# Patient Record
Sex: Female | Born: 1965 | Race: Black or African American | Hispanic: No | Marital: Single | Smoking: Never smoker
Health system: Southern US, Community
[De-identification: ages and names within clinical notes are randomized; demographics above are authoritative.]

## PROBLEM LIST (undated history)

## (undated) DIAGNOSIS — E119 Type 2 diabetes mellitus without complications: Secondary | ICD-10-CM

## (undated) DIAGNOSIS — I10 Essential (primary) hypertension: Secondary | ICD-10-CM

---

## 2005-10-15 ENCOUNTER — Emergency Department: Payer: Self-pay | Admitting: Emergency Medicine

## 2008-10-28 ENCOUNTER — Emergency Department: Payer: Self-pay | Admitting: Internal Medicine

## 2019-04-09 ENCOUNTER — Other Ambulatory Visit: Payer: Self-pay

## 2019-04-09 ENCOUNTER — Emergency Department: Payer: 59

## 2019-04-09 ENCOUNTER — Emergency Department
Admission: EM | Admit: 2019-04-09 | Discharge: 2019-04-09 | Disposition: A | Discharge status: Home / Self Care | Payer: 59 | Attending: Emergency Medicine | Admitting: Emergency Medicine

## 2019-04-09 ENCOUNTER — Encounter: Payer: Self-pay | Admitting: Emergency Medicine

## 2019-04-09 DIAGNOSIS — K122 Cellulitis and abscess of mouth: Secondary | ICD-10-CM | POA: Diagnosis not present

## 2019-04-09 DIAGNOSIS — D649 Anemia, unspecified: Secondary | ICD-10-CM | POA: Diagnosis not present

## 2019-04-09 DIAGNOSIS — E119 Type 2 diabetes mellitus without complications: Secondary | ICD-10-CM | POA: Diagnosis not present

## 2019-04-09 DIAGNOSIS — Z7984 Long term (current) use of oral hypoglycemic drugs: Secondary | ICD-10-CM | POA: Insufficient documentation

## 2019-04-09 DIAGNOSIS — I10 Essential (primary) hypertension: Secondary | ICD-10-CM | POA: Insufficient documentation

## 2019-04-09 DIAGNOSIS — R221 Localized swelling, mass and lump, neck: Secondary | ICD-10-CM | POA: Diagnosis present

## 2019-04-09 HISTORY — DX: Type 2 diabetes mellitus without complications: E11.9

## 2019-04-09 HISTORY — DX: Essential (primary) hypertension: I10

## 2019-04-09 LAB — CBC WITH DIFFERENTIAL/PLATELET
Abs Immature Granulocytes: 0.02 10*3/uL (ref 0.00–0.07)
Basophils Absolute: 0 10*3/uL (ref 0.0–0.1)
Basophils Relative: 0 %
Eosinophils Absolute: 0 10*3/uL (ref 0.0–0.5)
Eosinophils Relative: 0 %
HCT: 33.6 % — ABNORMAL LOW (ref 36.0–46.0)
Hemoglobin: 10.1 g/dL — ABNORMAL LOW (ref 12.0–15.0)
Immature Granulocytes: 0 %
Lymphocytes Relative: 32 %
Lymphs Abs: 1.5 10*3/uL (ref 0.7–4.0)
MCH: 27.7 pg (ref 26.0–34.0)
MCHC: 30.1 g/dL (ref 30.0–36.0)
MCV: 92.3 fL (ref 80.0–100.0)
Monocytes Absolute: 0.3 10*3/uL (ref 0.1–1.0)
Monocytes Relative: 6 %
Neutro Abs: 2.9 10*3/uL (ref 1.7–7.7)
Neutrophils Relative %: 62 %
Platelets: 407 10*3/uL — ABNORMAL HIGH (ref 150–400)
RBC: 3.64 MIL/uL — ABNORMAL LOW (ref 3.87–5.11)
RDW: 16.3 % — ABNORMAL HIGH (ref 11.5–15.5)
WBC: 4.6 10*3/uL (ref 4.0–10.5)
nRBC: 0 % (ref 0.0–0.2)

## 2019-04-09 LAB — BASIC METABOLIC PANEL
Anion gap: 10 (ref 5–15)
BUN: 14 mg/dL (ref 6–20)
CO2: 30 mmol/L (ref 22–32)
Calcium: 9.3 mg/dL (ref 8.9–10.3)
Chloride: 102 mmol/L (ref 98–111)
Creatinine, Ser: 0.88 mg/dL (ref 0.44–1.00)
GFR calc Af Amer: >60 mL/min (ref >60)
GFR calc non Af Amer: >60 mL/min (ref >60)
Glucose, Bld: 112 mg/dL — ABNORMAL HIGH (ref 70–99)
Potassium: 3.9 mmol/L (ref 3.5–5.1)
Sodium: 142 mmol/L (ref 135–145)

## 2019-04-09 MED ORDER — DEXAMETHASONE SODIUM PHOSPHATE 10 MG/ML IJ SOLN
10.0000 mg | Freq: Once | INTRAMUSCULAR | Status: AC
  Administered 2019-04-09: 10 mg via INTRAMUSCULAR
  Filled 2019-04-09: qty 1

## 2019-04-09 MED ORDER — PREDNISONE 10 MG PO TABS: ORAL_TABLET | ORAL | 0 refills | Status: DC

## 2019-04-09 NOTE — ED Triage Notes (Signed)
First RN Note: Pt presents to ED via POV with c/o tonsillar swelling. Pt able to maintain her own secretions at this time, no muffled voice upon arrival.

## 2019-04-09 NOTE — ED Provider Notes (Signed)
[facility] Emergency Department Provider Note   ____________________________________________   First MD Initiated Contact with Patient 04/09/19 [phone]     (approximate)  I have reviewed the triage vital signs and the nursing notes.   HISTORY  Chief Complaint Sore Throat   HPI Veronica Lewis is a 54 y.o. female resents to the ED with complaint of uvular swelling.  Patient states that she went to her PCP yesterday for routine visit and renewal of her prescriptions.  She states that she woke up with her uvula swollen.  Initially she thought it was because she slept with her mouth open.  Patient states that the discomfort is increased with swallowing.  She denies any fever, chills, nausea or vomiting.  She denies any known exposure to strep.  Patient continues to swallow her secretions and talk without any difficulty.      Past Medical History:  Diagnosis Date  . Diabetes mellitus without complication (HCC)   . Hypertension     There are no problems to display for this patient.   History reviewed. No pertinent surgical history.  Prior to Admission medications   Medication Sig Start Date End Date Taking? Authorizing Provider  lisinopril (ZESTRIL) 20 MG tablet Take 20 mg by mouth daily.   Yes [provider]  metFORMIN (GLUCOPHAGE) 500 MG tablet Take 500 mg by mouth 2 (two) times daily with a meal.   Yes [provider]  predniSONE (DELTASONE) 10 MG tablet Take 6 tablets  today, on day 2 take 5 tablets, day 3 take 4 tablets, day 4 take 3 tablets, day 5 take  2 tablets and 1 tablet the last day 04/09/19   Tommi Rumps, PA-C    Allergies Aspirin  No family history on file.  Social History Social History   Tobacco Use  . Smoking status: Not on file  Substance Use Topics  . Alcohol use: Not on file  . Drug use: Not on file    Review of Systems Constitutional: No fever/chills Eyes: No visual changes. ENT: No sore  throat.  Positive swollen uvula. Cardiovascular: Denies chest pain. Respiratory: Denies shortness of breath.  Negative for cough. Gastrointestinal: No abdominal pain.  No nausea, no vomiting.  No diarrhea. Musculoskeletal: Negative for muscle aches. Skin: Negative for rash. Neurological: Negative for headaches, focal weakness or numbness. ____________________________________________   PHYSICAL EXAM:  VITAL SIGNS: ED Triage Vitals  Enc Vitals Group     BP 04/09/19 0806 138/89     Pulse Rate 04/09/19 0806 80     Resp 04/09/19 0806 18     Temp 04/09/19 0806 97.7 F (36.5 C)     Temp Source 04/09/19 0806 Oral     SpO2 04/09/19 0806 97 %     Weight 04/09/19 0800 229 lb (103.9 kg)     Height 04/09/19 0800 5' (1.524 m)     Head Circumference --      Peak Flow --      Pain Score 04/09/19 0759 0     Pain Loc --      Pain Edu? --      Excl. in GC? --     Constitutional: Alert and oriented. Well appearing and in no acute distress.  Patient is able to talk in complete sentences and also maintains secretions without difficulty. Eyes: Conjunctivae are normal. PERRL. EOMI. Head: Atraumatic. Nose: No congestion/rhinnorhea.  Mouth/Throat: Mucous membranes are moist.  Oropharynx non-erythematous.  Uvula is edematous without erythema and  no exudate was noted. Neck: No stridor.   Hematological/Lymphatic/Immunilogical: No cervical lymphadenopathy. Cardiovascular: Normal rate, regular rhythm. Grossly normal heart sounds.  Good peripheral circulation. Respiratory: Normal respiratory effort.  No retractions. Lungs occasional faint expiratory wheezes heard especially upper left area. Gastrointestinal: Soft and nontender. No distention.  Musculoskeletal: Moves upper and lower extremities they have difficulty normal gait was noted. Neurologic:  Normal speech and language. No gross focal neurologic deficits are appreciated. No gait instability. Skin:  Skin is warm, dry and intact. No rash  noted. Psychiatric: Mood and affect are normal. Speech and behavior are normal.  ____________________________________________   LABS (all labs ordered are listed, but only abnormal results are displayed)  Labs Reviewed  CBC WITH DIFFERENTIAL/PLATELET - Abnormal; Notable for the following components:      Result Value   RBC 3.64 (*)    Hemoglobin 10.1 (*)    HCT 33.6 (*)    RDW 16.3 (*)    Platelets 407 (*)    All other components within normal limits  BASIC METABOLIC PANEL - Abnormal; Notable for the following components:   Glucose, Bld 112 (*)    All other components within normal limits   _RADIOLOGY   Official radiology report(s): DG Chest 2 View  Result Date: 04/09/2019 CLINICAL DATA:  Wheezing, no history of asthma or smoking. Additional history provided: Patient reports tonsillar swelling. EXAM: CHEST - 2 VIEW COMPARISON:  No pertinent prior studies available for comparison. FINDINGS: Heart size within normal limits. There is no evidence of airspace consolidation within the lungs. No evidence of pleural effusion or pneumothorax. No acute bony abnormality. S-shaped curvature of the thoracolumbar spine. IMPRESSION: No evidence of acute cardiopulmonary abnormality. S-shaped curvature of the thoracolumbar spine. Electronically Signed   By: Jackey Loge DO   On: 04/09/2019 08:51    ____________________________________________   PROCEDURES  Procedure(s) performed (including Critical Care):  Procedures   ____________________________________________   INITIAL IMPRESSION / ASSESSMENT AND PLAN / ED COURSE  As part of my medical decision making, I reviewed the following data within the electronic MEDICAL RECORD NUMBER Notes from prior ED visits and [place] Controlled Substance Database   54 year old female presents to the ED with complaint of uvula swelling.  Patient states she was seen in her office yesterday for routine checkup and renewal of her prescriptions.  Patient states that  this morning when she woke up she has some discomfort in her throat and thought this was from sleeping with her mouth open.  She then noticed that the uvula was swollen.  She denies any fever, chills, nausea or vomiting.  She denies any exposure to Covid.  Patient was given Decadron 10 mg IV while waiting for her lab results.  Hemoglobin/ hematocrit are consistent with anemia has a history of  anemia.  Today's lab work was compared with her doctors lab work that was done yesterday and no urgent changes.  Patient was made aware and will follow up with her PCP.  Patient was discharged with a prescription for prednisone 60 mg 6-day taper.  She is return to the emergency department if any severe worsening of her symptoms.  She is also to follow-up with [facility] if not improving.  ____________________________________________   FINAL CLINICAL IMPRESSION(S) / ED DIAGNOSES  Final diagnoses:  Uvulitis  Anemia, unspecified type     ED Discharge Orders         Ordered    predniSONE (DELTASONE) 10 MG tablet     04/09/19 1011  Note:  This document was prepared using Dragon voice recognition software and may include unintentional dictation errors.    Johnn Hai, PA-C 04/09/19 1219    Harvest Dark, MD 04/09/19 1431

## 2019-04-09 NOTE — Discharge Instructions (Signed)
Follow up with your primary care provider if any continued problems.  If any worsening of your symptoms contact  ENT for further evaluation.  Dr. Andee Poles is the doctor on call.  Take prednisone as directed.  This may increase your blood sugar for short period of time while you are on the prednisone.  Be mindful of the foods that you are eating to help control your blood sugar.  You may also take Tylenol with this medication if needed for pain and discomfort.  Do not take any of the NSAIDs while taking the prednisone.  Increase fluids.

## 2019-06-03 ENCOUNTER — Encounter: Payer: Self-pay | Admitting: Emergency Medicine

## 2019-06-03 ENCOUNTER — Inpatient Hospital Stay
Admission: EM | Admit: 2019-06-03 | Discharge: 2019-06-05 | DRG: 603 | Disposition: A | Discharge status: Home / Self Care | Payer: 59 | Attending: Hospitalist | Admitting: Hospitalist

## 2019-06-03 ENCOUNTER — Emergency Department: Payer: 59

## 2019-06-03 ENCOUNTER — Other Ambulatory Visit: Payer: Self-pay

## 2019-06-03 DIAGNOSIS — Z886 Allergy status to analgesic agent status: Secondary | ICD-10-CM | POA: Diagnosis not present

## 2019-06-03 DIAGNOSIS — R6884 Jaw pain: Secondary | ICD-10-CM | POA: Diagnosis present

## 2019-06-03 DIAGNOSIS — H9202 Otalgia, left ear: Secondary | ICD-10-CM | POA: Diagnosis present

## 2019-06-03 DIAGNOSIS — I10 Essential (primary) hypertension: Secondary | ICD-10-CM | POA: Diagnosis present

## 2019-06-03 DIAGNOSIS — Z8249 Family history of ischemic heart disease and other diseases of the circulatory system: Secondary | ICD-10-CM

## 2019-06-03 DIAGNOSIS — Z820 Family history of epilepsy and other diseases of the nervous system: Secondary | ICD-10-CM

## 2019-06-03 DIAGNOSIS — E876 Hypokalemia: Secondary | ICD-10-CM | POA: Diagnosis present

## 2019-06-03 DIAGNOSIS — Z91013 Allergy to seafood: Secondary | ICD-10-CM

## 2019-06-03 DIAGNOSIS — L03211 Cellulitis of face: Principal | ICD-10-CM | POA: Diagnosis present

## 2019-06-03 DIAGNOSIS — Z8349 Family history of other endocrine, nutritional and metabolic diseases: Secondary | ICD-10-CM

## 2019-06-03 DIAGNOSIS — Z20822 Contact with and (suspected) exposure to covid-19: Secondary | ICD-10-CM | POA: Diagnosis present

## 2019-06-03 DIAGNOSIS — Z79899 Other long term (current) drug therapy: Secondary | ICD-10-CM

## 2019-06-03 DIAGNOSIS — Z7952 Long term (current) use of systemic steroids: Secondary | ICD-10-CM

## 2019-06-03 DIAGNOSIS — T783XXD Angioneurotic edema, subsequent encounter: Secondary | ICD-10-CM | POA: Diagnosis not present

## 2019-06-03 DIAGNOSIS — R220 Localized swelling, mass and lump, head: Secondary | ICD-10-CM | POA: Diagnosis not present

## 2019-06-03 DIAGNOSIS — Z7984 Long term (current) use of oral hypoglycemic drugs: Secondary | ICD-10-CM | POA: Diagnosis not present

## 2019-06-03 DIAGNOSIS — K056 Periodontal disease, unspecified: Secondary | ICD-10-CM | POA: Diagnosis present

## 2019-06-03 DIAGNOSIS — T783XXA Angioneurotic edema, initial encounter: Secondary | ICD-10-CM | POA: Diagnosis present

## 2019-06-03 DIAGNOSIS — Z811 Family history of alcohol abuse and dependence: Secondary | ICD-10-CM

## 2019-06-03 DIAGNOSIS — Z6841 Body Mass Index (BMI) 40.0 and over, adult: Secondary | ICD-10-CM

## 2019-06-03 DIAGNOSIS — E119 Type 2 diabetes mellitus without complications: Secondary | ICD-10-CM | POA: Diagnosis present

## 2019-06-03 DIAGNOSIS — Z833 Family history of diabetes mellitus: Secondary | ICD-10-CM

## 2019-06-03 HISTORY — DX: Cellulitis of face: L03.211

## 2019-06-03 LAB — GLUCOSE, CAPILLARY
Glucose-Capillary: 66 mg/dL — ABNORMAL LOW (ref 70–99)
Glucose-Capillary: 81 mg/dL (ref 70–99)
Glucose-Capillary: 85 mg/dL (ref 70–99)
Glucose-Capillary: 162 mg/dL — ABNORMAL HIGH (ref 70–99)
Glucose-Capillary: 128 mg/dL — ABNORMAL HIGH (ref 70–99)
Glucose-Capillary: 133 mg/dL — ABNORMAL HIGH (ref 70–99)

## 2019-06-03 LAB — BASIC METABOLIC PANEL
Anion gap: 11 (ref 5–15)
BUN: 9 mg/dL (ref 6–20)
CO2: 29 mmol/L (ref 22–32)
Calcium: 9.3 mg/dL (ref 8.9–10.3)
Chloride: 99 mmol/L (ref 98–111)
Creatinine, Ser: 0.97 mg/dL (ref 0.44–1.00)
GFR calc Af Amer: >60 mL/min (ref >60)
GFR calc non Af Amer: >60 mL/min (ref >60)
Glucose, Bld: 140 mg/dL — ABNORMAL HIGH (ref 70–99)
Potassium: 3.3 mmol/L — ABNORMAL LOW (ref 3.5–5.1)
Sodium: 139 mmol/L (ref 135–145)

## 2019-06-03 LAB — CBC WITH DIFFERENTIAL/PLATELET
Abs Immature Granulocytes: 0.05 10*3/uL (ref 0.00–0.07)
Basophils Absolute: 0 10*3/uL (ref 0.0–0.1)
Basophils Relative: 0 %
Eosinophils Absolute: 0 10*3/uL (ref 0.0–0.5)
Eosinophils Relative: 0 %
HCT: 34.9 % — ABNORMAL LOW (ref 36.0–46.0)
Hemoglobin: 10.7 g/dL — ABNORMAL LOW (ref 12.0–15.0)
Immature Granulocytes: 0 %
Lymphocytes Relative: 10 %
Lymphs Abs: 1.2 10*3/uL (ref 0.7–4.0)
MCH: 28.2 pg (ref 26.0–34.0)
MCHC: 30.7 g/dL (ref 30.0–36.0)
MCV: 91.8 fL (ref 80.0–100.0)
Monocytes Absolute: 0.6 10*3/uL (ref 0.1–1.0)
Monocytes Relative: 5 %
Neutro Abs: 9.8 10*3/uL — ABNORMAL HIGH (ref 1.7–7.7)
Neutrophils Relative %: 85 %
Platelets: 349 10*3/uL (ref 150–400)
RBC: 3.8 MIL/uL — ABNORMAL LOW (ref 3.87–5.11)
RDW: 18 % — ABNORMAL HIGH (ref 11.5–15.5)
WBC: 11.6 10*3/uL — ABNORMAL HIGH (ref 4.0–10.5)
nRBC: 0.2 % (ref 0.0–0.2)

## 2019-06-03 LAB — MRSA PCR SCREENING: MRSA by PCR: POSITIVE — AB

## 2019-06-03 LAB — HIV ANTIBODY (ROUTINE TESTING W REFLEX): HIV Screen 4th Generation wRfx: NONREACTIVE

## 2019-06-03 LAB — SARS CORONAVIRUS 2 BY RT PCR (HOSPITAL ORDER, PERFORMED IN ~~LOC~~ HOSPITAL LAB): SARS Coronavirus 2: NEGATIVE

## 2019-06-03 MED ORDER — INSULIN ASPART 100 UNIT/ML ~~LOC~~ SOLN
0.0000 [IU] | SUBCUTANEOUS | Status: DC
  Administered 2019-06-03: 4 [IU] via SUBCUTANEOUS
  Filled 2019-06-03: qty 1

## 2019-06-03 MED ORDER — IOHEXOL 300 MG/ML  SOLN
75.0000 mL | Freq: Once | INTRAMUSCULAR | Status: AC | PRN
  Administered 2019-06-03: 75 mL via INTRAVENOUS

## 2019-06-03 MED ORDER — INSULIN ASPART 100 UNIT/ML ~~LOC~~ SOLN: 0.0000 [IU] | Freq: Three times a day (TID) | SUBCUTANEOUS | Status: DC

## 2019-06-03 MED ORDER — SODIUM CHLORIDE 0.9 % IV SOLN
3.0000 g | Freq: Once | INTRAVENOUS | Status: AC
  Administered 2019-06-03: 3 g via INTRAVENOUS

## 2019-06-03 MED ORDER — ONDANSETRON HCL 4 MG/2ML IJ SOLN: 4.0000 mg | Freq: Four times a day (QID) | INTRAMUSCULAR | Status: DC | PRN

## 2019-06-03 MED ORDER — MUPIROCIN 2 % EX OINT
1.0000 "application " | TOPICAL_OINTMENT | Freq: Two times a day (BID) | CUTANEOUS | Status: DC
  Administered 2019-06-03 – 2019-06-04 (×3): 1 via NASAL
  Filled 2019-06-03: qty 22

## 2019-06-03 MED ORDER — KETOROLAC TROMETHAMINE 30 MG/ML IJ SOLN
15.0000 mg | INTRAMUSCULAR | Status: AC
  Administered 2019-06-03: 15 mg via INTRAVENOUS
  Filled 2019-06-03: qty 1

## 2019-06-03 MED ORDER — MORPHINE SULFATE (PF) 2 MG/ML IV SOLN
2.0000 mg | INTRAVENOUS | Status: DC | PRN
  Administered 2019-06-03 – 2019-06-04 (×4): 2 mg via INTRAVENOUS
  Filled 2019-06-03 (×4): qty 1

## 2019-06-03 MED ORDER — HYDRALAZINE HCL 20 MG/ML IJ SOLN
10.0000 mg | Freq: Four times a day (QID) | INTRAMUSCULAR | Status: DC | PRN
  Administered 2019-06-03: 10 mg via INTRAVENOUS
  Filled 2019-06-03: qty 1

## 2019-06-03 MED ORDER — CHLORHEXIDINE GLUCONATE CLOTH 2 % EX PADS
6.0000 | MEDICATED_PAD | Freq: Every day | CUTANEOUS | Status: DC
  Administered 2019-06-04 – 2019-06-05 (×2): 6 via TOPICAL

## 2019-06-03 MED ORDER — SODIUM CHLORIDE 0.9 % IV SOLN
2.0000 g | INTRAVENOUS | Status: DC
  Administered 2019-06-03: 2 g via INTRAVENOUS
  Filled 2019-06-03 (×2): qty 20

## 2019-06-03 MED ORDER — DEXAMETHASONE NICU IV SYRINGE 4 MG/ML: 4.0000 mg | Freq: Three times a day (TID) | INTRAMUSCULAR | Status: DC

## 2019-06-03 MED ORDER — POTASSIUM CHLORIDE IN NACL 20-0.45 MEQ/L-% IV SOLN
INTRAVENOUS | Status: DC
  Filled 2019-06-03 (×6): qty 1000

## 2019-06-03 MED ORDER — DEXAMETHASONE SODIUM PHOSPHATE 10 MG/ML IJ SOLN
4.0000 mg | Freq: Three times a day (TID) | INTRAMUSCULAR | Status: DC
  Administered 2019-06-03: 4 mg via INTRAVENOUS
  Filled 2019-06-03: qty 0.4
  Filled 2019-06-03: qty 1
  Filled 2019-06-03: qty 0.4

## 2019-06-03 MED ORDER — FLORANEX PO PACK
1.0000 g | PACK | Freq: Every day | ORAL | Status: DC
  Administered 2019-06-04: 11:00:00 1 g via ORAL
  Filled 2019-06-03 (×4): qty 1

## 2019-06-03 MED ORDER — INSULIN ASPART 100 UNIT/ML ~~LOC~~ SOLN: 0.0000 [IU] | Freq: Every day | SUBCUTANEOUS | Status: DC

## 2019-06-03 MED ORDER — CLINDAMYCIN PHOSPHATE 600 MG/50ML IV SOLN
600.0000 mg | Freq: Three times a day (TID) | INTRAVENOUS | Status: DC
  Administered 2019-06-03 – 2019-06-04 (×3): 600 mg via INTRAVENOUS
  Filled 2019-06-03 (×8): qty 50

## 2019-06-03 MED ORDER — ONDANSETRON HCL 4 MG PO TABS: 4.0000 mg | ORAL_TABLET | Freq: Four times a day (QID) | ORAL | Status: DC | PRN

## 2019-06-03 MED ORDER — ENOXAPARIN SODIUM 40 MG/0.4ML ~~LOC~~ SOLN
40.0000 mg | Freq: Two times a day (BID) | SUBCUTANEOUS | Status: DC
  Administered 2019-06-03 – 2019-06-04 (×4): 40 mg via SUBCUTANEOUS
  Filled 2019-06-03 (×5): qty 0.4

## 2019-06-03 MED ORDER — AMLODIPINE BESYLATE 5 MG PO TABS
5.0000 mg | ORAL_TABLET | Freq: Every day | ORAL | Status: DC
  Administered 2019-06-03: 5 mg via ORAL
  Filled 2019-06-03: qty 1

## 2019-06-03 MED ORDER — SODIUM CHLORIDE 0.9 % IV SOLN: INTRAVENOUS | Status: DC

## 2019-06-03 MED ORDER — SODIUM CHLORIDE 0.9 % IV BOLUS
500.0000 mL | Freq: Once | INTRAVENOUS | Status: AC
  Administered 2019-06-03: 500 mL via INTRAVENOUS

## 2019-06-03 NOTE — Progress Notes (Signed)
Patient alert & oriented x 4.  No respiratory distress or complaints of difficulty swallowing.  Sating 98% on RA.  Ambulates in room to [facility] without difficulty.  Patient's SBP remains under 160.  Pain being managed with PRN morphine. Tolerating ice chips and will be advanced to full liquid diet.

## 2019-06-03 NOTE — Consult Note (Signed)
Veronica Lewis, Veronica Lewis [id number] 1965-09-10 Lucile Shutters, MD  Reason for Consult: cellulitis  HPI: 54 y.o. female presents to ER for 1 day history of lower lip swelling.  Patient reports issues with lower mandibular tooth in past and reports pain surrounding the area.  Given acute onset, CT scan was performed in ER that is consistent with cellulitis from odontogenic source.  Continues to worsen this morning.  Has associated jaw pain, nonradiating.  Denies difficulty swallowing or breathing.  No chest pain shortness of breath or cough.  History of lisinopril but has not taken in 1 week.  Remaining ROS negative.   Allergies:  Allergies  Allergen Reactions  . Aspirin Other (See Comments)    Makes her feel cold     ROS: Review of systems normal other than 12 systems except per HPI.  PMH:  Past Medical History:  Diagnosis Date  . Diabetes mellitus without complication (HCC)   . Facial cellulitis 06/03/2019  . Hypertension     FH: No family history on file.  SH:  Social History   Socioeconomic History  . Marital status: Single    Spouse name: Not on file  . Number of children: Not on file  . Years of education: Not on file  . Highest education level: Not on file  Occupational History  . Not on file  Tobacco Use  . Smoking status: Never Smoker  . Smokeless tobacco: Never Used  Substance and Sexual Activity  . Alcohol use: Not on file  . Drug use: Not on file  . Sexual activity: Not on file  Other Topics Concern  . Not on file  Social History Narrative  . Not on file   Social Determinants of Health   Financial Resource Strain:   . Difficulty of Paying Living Expenses:   Food Insecurity:   . Worried About Programme researcher, broadcasting/film/video in the Last Year:   . Barista in the Last Year:   Transportation Needs:   . Freight forwarder (Medical):   Marland Kitchen Lack of Transportation (Non-Medical):   Physical Activity:   . Days of Exercise per Week:   . Minutes of Exercise  per Session:   Stress:   . Feeling of Stress :   Social Connections:   . Frequency of Communication with Friends and Family:   . Frequency of Social Gatherings with Friends and Family:   . Attends Religious Services:   . Active Member of Clubs or Organizations:   . Attends Banker Meetings:   Marland Kitchen Marital Status:   Intimate Partner Violence:   . Fear of Current or Ex-Partner:   . Emotionally Abused:   Marland Kitchen Physically Abused:   . Sexually Abused:     PSH: History reviewed. No pertinent surgical history.  Physical  Exam:  GEN-  Sleeping supine in bed in NAD RESP- unlabored with no stridor NEURO-CN 2-12 grossly intact and symmetric. CARD-  RRR EARS-  External ears clear NOSE-  Post surgical reduction of turbinates, no mucopus or polyps seen OC/OP- lower lip soft tissue swelling with widening of gingival near mandibular tooth with tenderness to palpation.  Floor of mouth soft. NECK-  No significant masses or lesions  CT-  Soft tissue swelling of left lower lip and mandible and peri-apical abscess of tooth   A/P: Lower lip/facial cellulitis with odontogenic source  Plan:  I do not suspect this is angioedema given timing and asymmetrical lower lip edema and history.  Agree with IV  antibiotics.  Hold on steroids at this point given no airway issues and watch for improvement.  If swelling worsens, can consider steroids in addition to antibiotics.  Recommend follow up as outpatient in 1 week.  Recommend quick follow up with dentist/oral surgeon for dental extraction.   Doss Cybulski 06/03/2019 5:00 PM

## 2019-06-03 NOTE — ED Provider Notes (Signed)
[facility] Emergency Department Provider Note  ____________________________________________  Time seen: Approximately 8:28 AM  I have reviewed the triage vital signs and the nursing notes.   HISTORY  Chief Complaint Facial Swelling    HPI [name] is a 54 y.o. female with a history of hypertension and diabetes who complains of left lip and facial swelling for the past 1 day, started about 5 PM yesterday, gradual onset, worsening.  Continues to worsen this morning.  Has associated jaw pain that is 10/10 in intensity, nonradiating.  Denies difficulty swallowing or breathing.  No chest pain shortness of breath or cough.  Reports this is happened before.  She is on lisinopril but last took it 1 week ago.  Denies any other swelling episodes in other parts of the body.  She does note that she is having dental pain recently and feels like she has a tooth that needs to be pulled.  Denies neck pain or stiffness.      Past Medical History:  Diagnosis Date  . Diabetes mellitus without complication (HCC)   . Hypertension      There are no problems to display for this patient.    History reviewed. No pertinent surgical history.   Prior to Admission medications   Medication Sig Start Date End Date Taking? Authorizing Provider  lisinopril (ZESTRIL) 20 MG tablet Take 20 mg by mouth daily.    [provider]  metFORMIN (GLUCOPHAGE) 500 MG tablet Take 500 mg by mouth 2 (two) times daily with a meal.    [provider]  predniSONE (DELTASONE) 10 MG tablet Take 6 tablets  today, on day 2 take 5 tablets, day 3 take 4 tablets, day 4 take 3 tablets, day 5 take  2 tablets and 1 tablet the last day 04/09/19   Tommi Rumps, PA-C     Allergies Aspirin   No family history on file.  Social History Social History   Tobacco Use  . Smoking status: Never Smoker  . Smokeless tobacco: Never Used  Substance Use Topics  . Alcohol use: Not  on file  . Drug use: Not on file    Review of Systems  Constitutional:   No fever or chills.  ENT:   No sore throat. No rhinorrhea.  Positive left jaw pain and swelling Cardiovascular:   No chest pain or syncope. Respiratory:   No dyspnea or cough. Gastrointestinal:   Negative for abdominal pain, vomiting and diarrhea.  Musculoskeletal:   Negative for focal pain or swelling All other systems reviewed and are negative except as documented above in ROS and HPI.  ____________________________________________   PHYSICAL EXAM:  VITAL SIGNS: ED Triage Vitals  Enc Vitals Group     BP 06/03/19 0730 (!) 145/107     Pulse Rate 06/03/19 0730 (!) 124     Resp 06/03/19 0730 20     Temp 06/03/19 0730 99.2 F (37.3 C)     Temp Source 06/03/19 0730 Oral     SpO2 06/03/19 0730 100 %     Weight 06/03/19 0725 229 lb 0.9 oz (103.9 kg)     Height 06/03/19 0725 5' (1.524 m)     Head Circumference --      Peak Flow --      Pain Score 06/03/19 0724 10     Pain Loc --      Pain Edu? --      Excl. in GC? --     Vital signs  reviewed, nursing assessments reviewed.   Constitutional:   Alert and oriented. Non-toxic appearance. Eyes:   Conjunctivae are normal. EOMI. PERRL. ENT      Head:   Normocephalic and atraumatic.      Nose:   Normal      Mouth/Throat: Pronounced firm swelling of the left lower lip.  Poor dentition.  No gingival swelling, no intraoral purulent drainage.  No tongue elevation or floor mouth edema, no tongue swelling      Neck:   No meningismus. Full ROM.  Left submandibular neck has pronounced swelling, tenderness.  No fluctuance.  Submental space is not swollen Hematological/Lymphatic/Immunilogical:   No apparent lymphadenopathy. Cardiovascular: Tachycardia, regular rhythm, heart rate 100. Symmetric bilateral radial and DP pulses.  No murmurs. Cap refill less than 2 seconds. Respiratory:   Normal respiratory effort without tachypnea/retractions. Breath sounds are clear and  equal bilaterally. No wheezes/rales/rhonchi. Gastrointestinal:   Soft and nontender. Non distended. There is no CVA tenderness.  No rebound, rigidity, or guarding. Musculoskeletal:   Normal range of motion in all extremities. No joint effusions.  No lower extremity tenderness.  No edema. Neurologic:   Normal speech and language.  Motor grossly intact. No acute focal neurologic deficits are appreciated.  Skin:    Skin is warm, dry and intact. No rash noted.  No petechiae, purpura, or bullae.  ____________________________________________    LABS (pertinent positives/negatives) (all labs ordered are listed, but only abnormal results are displayed) Labs Reviewed  BASIC METABOLIC PANEL - Abnormal; Notable for the following components:      Result Value   Potassium 3.3 (*)    Glucose, Bld 140 (*)    All other components within normal limits  CBC WITH DIFFERENTIAL/PLATELET - Abnormal; Notable for the following components:   WBC 11.6 (*)    RBC 3.80 (*)    Hemoglobin 10.7 (*)    HCT 34.9 (*)    RDW 18.0 (*)    Neutro Abs 9.8 (*)    All other components within normal limits  SARS CORONAVIRUS 2 BY RT PCR (HOSPITAL ORDER, [place] [facility])   ____________________________________________   EKG    ____________________________________________    RADIOLOGY  CT Soft Tissue Neck W Contrast  Result Date: 06/03/2019 CLINICAL DATA:  Left-sided facial swelling over the last day. EXAM: CT NECK WITH CONTRAST TECHNIQUE: Multidetector CT imaging of the neck was performed using the standard protocol following the bolus administration of intravenous contrast. CONTRAST:  86mL OMNIPAQUE IOHEXOL 300 MG/ML  SOLN COMPARISON:  None. FINDINGS: Pharynx and larynx: No mucosal or submucosal lesion. Salivary glands: Parotid and submandibular glands are normal. Thyroid: Normal Lymph nodes: No enlarged or low-density nodes on either side of the neck. Vascular: Normal. Carotid arteries are  tortuous, swinging to midline behind the oropharynx. Limited intracranial: Normal Visualized orbits: Normal Mastoids and visualized paranasal sinuses: No acute sinus disease. 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 Skeleton: Ordinary cervical spondylosis. Upper chest: Negative Other: Nonspecific soft tissue edema of the left side of the face consistent with left facial cellulitis. No evidence of drainable abscess. Facial inflammation probably derives from dental disease of the left mandibular canine tooth, the sole remaining left-sided tooth. IMPRESSION: Left facial cellulitis. No CT evidence of drainable abscess. 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 Electronically Signed   By: Nelson Chimes M.D.   On: 06/03/2019 09:20  ____________________________________________   PROCEDURES .1-3 Lead EKG Interpretation Performed by: Sharman Cheek, MD Authorized by: Sharman Cheek, MD     Interpretation: normal     ECG rate:  100   ECG rate assessment: normal     Rhythm: sinus rhythm     Ectopy: none     Conduction: normal   Comments:           ____________________________________________  DIFFERENTIAL DIAGNOSIS   Bradykinin mediated angioedema, lymphadenitis, soft tissue abscess of the neck.  Doubt Ludwig angina.  Airway uninvolved.   CLINICAL IMPRESSION / ASSESSMENT AND PLAN / ED COURSE  Medications ordered in the ED: Medications  Ampicillin-Sulbactam (UNASYN) 3 g in sodium chloride 0.9 % 100 mL IVPB (has no administration in time range)  sodium chloride 0.9 % bolus 500 mL (0 mLs Intravenous Stopped 06/03/19 0955)  ketorolac (TORADOL) 30 MG/ML injection 15 mg (15 mg Intravenous Given 06/03/19 0818)  iohexol (OMNIPAQUE) 300 MG/ML solution 75 mL (75 mLs Intravenous Contrast Given 06/03/19 0855)    Pertinent labs & imaging results that  were available during my care of the patient were reviewed by me and considered in my medical decision making (see chart for details).  Veronica Lewis was evaluated in Emergency Department on 06/03/2019 for the symptoms described in the history of present illness. She was evaluated in the context of the global COVID-19 pandemic, which necessitated consideration that the patient might be at risk for infection with the SARS-CoV-2 virus that causes COVID-19. Institutional protocols and algorithms that pertain to the evaluation of patients at risk for COVID-19 are in a state of rapid change based on information released by regulatory bodies including the CDC and federal and state organizations. These policies and algorithms were followed during the patient's care in the ED.     Clinical Course as of Jun 03 1038  Thu Jun 03, 2019  6834 Patient presents with swelling tenderness and firmness of the left lower lip, jaw and submandibular neck.  No crepitus or purulent drainage.  No airway involvement, oropharynx appears normal, breath sounds are normal.  Last lisinopril was a week ago.  Suspect ACE angioedema versus PTA or other soft tissue abscess.  Will obtain a CT scan of the neck to further evaluate.  IV fluids for hydration, IV Toradol 10 mg for pain control.  On my exam heart rate is 100.  Other vital signs are normal.  Patient is not septic.   [PS]    Clinical Course User Index [PS] Sharman Cheek, MD     ----------------------------------------- 10:39 AM on 06/03/2019 ----------------------------------------- .  CT scan shows facial cellulitis, likely odontogenic origin.  No evidence of abscess or airway compromise on imaging.  Vital signs remain normal.  Recommend IV antibiotics and hospitalization to ensure improvement to which patient agrees.  Will start IV Unasyn.   ____________________________________________   FINAL CLINICAL IMPRESSION(S) / ED DIAGNOSES    Final diagnoses:   Facial cellulitis  Type 2 diabetes mellitus without complication, without long-term current use of insulin (HCC)  Morbid obesity [facility])     ED Discharge Orders    None      Portions of this note were generated with dragon dictation software. Dictation errors may occur despite best attempts at proofreading.   Sharman Cheek, MD 06/03/19 1040

## 2019-06-03 NOTE — Progress Notes (Signed)
   06/03/19 1720  Clinical Encounter Type  Visited With Patient  Visit Type Initial  Referral From Nurse  Consult/Referral To Chaplain  Spiritual Encounters  Spiritual Needs Other (Comment)  CH received Order Requisite to complete Advance Directive for pt. Pt recognized CH from community. Spent several minutes building rapport. CH educated pt on Corporate investment banker. Pt will decide if she wants to follow through after she talks with family. Pastoral visit was appreciated. No further needs expressed at this time.

## 2019-06-03 NOTE — ED Notes (Addendum)
Pt CBG checked, pt given 8 oz coke saltine crackers and peanut butter, encouraged to eat it. States that she will. Will recheck CBG.

## 2019-06-03 NOTE — ED Triage Notes (Signed)
Swelling to lower lip, left face and left jaw x 1 day.  Patient states she takes lisinopril.  AAOx3.  Skin warm and dry. Voice clear and strong.  Respirations regular and non labored.

## 2019-06-03 NOTE — Consult Note (Signed)
NAME: Veronica Lewis  DOB: 09-30-1965  MRN: [id number]  Date/Time: 06/03/2019 7:46 PM  REQUESTING PROVIDER: Dr. Marthenia Rolling Subjective:  REASON FOR CONSULT: Patient cellulitis ?History from patient and chart reviewed Veronica Lewis is a 54 y.o. female with a history of hypertension, diabetes mellitus is admitted with swelling of the left side of the face and lips 1 day duration.  Earache for couple of days. Patient works as Quarry manager at [employer].  Yesterday in the morning she started having severe pain in the left ear.  And by the time she had gone to bed and woke up this morning hurts her face started to swell on the left side and the lower lip is also swollen on this more towards the left.  She does not have any fever.  She has no difficulty in swallowing.  She has 1 loose tooth on the lower left side which is also painful. But 2 months ago she presented with swelling of her uvula which she says was the size of her finger and she had to push it to swallow.  She was given steroids and it resolved.  She takes lisinopril.  As she ran out of prescription a week ago she has not taken it in the last week.  She ate oyster and fish yesterday Past Medical History:  Diagnosis Date  . Diabetes mellitus without complication ([place])   . Facial cellulitis 06/03/2019  . Hypertension     History reviewed. No pertinent surgical history.  Social History   Socioeconomic History  . Marital status: Single    Spouse name: Not on file  . Number of children: Not on file  . Years of education: Not on file  . Highest education level: Not on file  Occupational History  . Not on file  Tobacco Use  . Smoking status: Never Smoker  . Smokeless tobacco: Never Used  Substance and Sexual Activity  . Alcohol use: Not on file  . Drug use: Not on file  . Sexual activity: Not on file  Other Topics Concern  . Not on file  Social History Narrative  . Not on file   Social Determinants of Health   Financial Resource Strain:    . Difficulty of Paying Living Expenses:   Food Insecurity:   . Worried About Charity fundraiser in the Last Year:   . Arboriculturist in the Last Year:   Transportation Needs:   . Film/video editor (Medical):   Marland Kitchen Lack of Transportation (Non-Medical):   Physical Activity:   . Days of Exercise per Week:   . Minutes of Exercise per Session:   Stress:   . Feeling of Stress :   Social Connections:   . Frequency of Communication with Friends and Family:   . Frequency of Social Gatherings with Friends and Family:   . Attends Religious Services:   . Active Member of Clubs or Organizations:   . Attends Archivist Meetings:   Marland Kitchen Marital Status:   Intimate Partner Violence:   . Fear of Current or Ex-Partner:   . Emotionally Abused:   Marland Kitchen Physically Abused:   . Sexually Abused:     Family history Alzheimer's disease mother Diabetes mellitus, hyperlipidemia, hypertension mother Alcohol abuse, coronary artery disease, hyperlipidemia, hypertension, prostate CA, thyroid disease Father Coronary artery disease and diabetes mellitus in sister    Allergies  Allergen Reactions  . Aspirin Other (See Comments)    Makes her feel cold     ?  Current Facility-Administered Medications  Medication Dose Route Frequency Provider Last Rate Last Admin  . 0.45 % NaCl with KCl 20 mEq / L infusion   Intravenous Continuous Agbata, Tochukwu, MD 125 mL/hr at 06/03/19 1900 Rate Verify at 06/03/19 1900  . [START ON 06/04/2019] Chlorhexidine Gluconate Cloth 2 % PADS 6 each  6 each Topical Q0600 Agbata, Tochukwu, MD      . clindamycin (CLEOCIN) IVPB 600 mg  600 mg Intravenous Q8H Agbata, Tochukwu, MD   Stopped at 06/03/19 1515  . enoxaparin (LOVENOX) injection 40 mg  40 mg Subcutaneous BID Agbata, Tochukwu, MD   40 mg at 06/03/19 1246  . hydrALAZINE (APRESOLINE) injection 10 mg  10 mg Intravenous Q6H PRN Agbata, Tochukwu, MD   10 mg at 06/03/19 1425  . [START ON 06/04/2019] insulin aspart (novoLOG)  injection 0-20 Units  0-20 Units Subcutaneous TID WC Agbata, Tochukwu, MD      . insulin aspart (novoLOG) injection 0-5 Units  0-5 Units Subcutaneous QHS Agbata, Tochukwu, MD      . lactobacillus (FLORANEX/LACTINEX) granules 1 g  1 g Oral Daily Agbata, Tochukwu, MD      . morphine 2 MG/ML injection 2 mg  2 mg Intravenous Q2H PRN Agbata, Tochukwu, MD   2 mg at 06/03/19 1419  . mupirocin ointment (BACTROBAN) 2 % 1 application  1 application Nasal BID Agbata, Tochukwu, MD      . ondansetron (ZOFRAN) tablet 4 mg  4 mg Oral Q6H PRN Agbata, Tochukwu, MD       Or  . ondansetron (ZOFRAN) injection 4 mg  4 mg Intravenous Q6H PRN Agbata, Tochukwu, MD         Abtx:  Anti-infectives (From admission, onward)   Start     Dose/Rate Route Frequency Ordered Stop   06/03/19 1400  clindamycin (CLEOCIN) IVPB 600 mg     600 mg 100 mL/hr over 30 Minutes Intravenous Every 8 hours 06/03/19 1136     06/03/19 1045  Ampicillin-Sulbactam (UNASYN) 3 g in sodium chloride 0.9 % 100 mL IVPB     3 g 200 mL/hr over 30 Minutes Intravenous  Once 06/03/19 1038 06/03/19 1128      REVIEW OF SYSTEMS:  Const: negative fever, negative chills, negative weight loss Eyes: negative diplopia or visual changes, negative eye pain ENT: negative coryza, negative sore throat Resp: negative cough, hemoptysis, dyspnea Cards: negative for chest pain, palpitations, lower extremity edema GU: negative for frequency, dysuria and hematuria GI: Negative for abdominal pain, diarrhea, bleeding, constipation Skin: negative for rash and pruritus Heme: negative for easy bruising and gum/nose bleeding MS: negative for myalgias, arthralgias, back pain and muscle weakness Neurolo:negative for headaches, dizziness, vertigo, memory problems  Psych: negative for feelings of anxiety, depression  Endocrine: Has diabetes Allergy/Immunology-as above Objective:  VITALS:  BP 131/89   Pulse (!) 107   Temp 99.4 F (37.4 C) (Oral)   Resp (!) 27   Ht 5'  (1.524 m)   Wt 103.7 kg   SpO2 96%   BMI 44.65 kg/m  PHYSICAL EXAM:  General: Alert, cooperative, no distress, appears stated age.  Head: Normocephalic, without obvious abnormality, atraumatic. Eyes: Conjunctivae clear, anicteric sclerae. Pupils are equal ENT Nares normal. No drainage or sinus tenderness. Left side of the face is swollen.  No erythema.  No tenderness Lower lip swollen but more swelling in the left side. On internal examination there is no tenderness. Dentition poor    Neck: Left side of the face and neck  swollen.    no carotid bruit and no JVD. Back: No CVA tenderness. Lungs: Clear to auscultation bilaterally. No Wheezing or Rhonchi. No rales. Heart: Regular rate and rhythm, no murmur, rub or gallop. Abdomen: Soft, non-tender,not distended. Bowel sounds normal. No masses Extremities: atraumatic, no cyanosis. No edema. No clubbing Skin: No rashes or lesions. Or bruising Lymph: Cervical, supraclavicular normal. Neurologic: Grossly non-focal Pertinent Labs Lab Results CBC    Component Value Date/Time   WBC 11.6 (H) 06/03/2019 0814   RBC 3.80 (L) 06/03/2019 0814   HGB 10.7 (L) 06/03/2019 0814   HCT 34.9 (L) 06/03/2019 0814   PLT 349 06/03/2019 0814   MCV 91.8 06/03/2019 0814   MCH 28.2 06/03/2019 0814   MCHC 30.7 06/03/2019 0814   RDW 18.0 (H) 06/03/2019 0814   LYMPHSABS 1.2 06/03/2019 0814   MONOABS 0.6 06/03/2019 0814   EOSABS 0.0 06/03/2019 0814   BASOSABS 0.0 06/03/2019 0814    CMP Latest Ref Rng & Units 06/03/2019 04/09/2019  Glucose 70 - 99 mg/dL 601(U) 932(T)  BUN 6 - 20 mg/dL 9 14  Creatinine 5.57 - 1.00 mg/dL 3.22 0.25  Sodium 427 - 145 mmol/L 139 142  Potassium 3.5 - 5.1 mmol/L 3.3(L) 3.9  Chloride 98 - 111 mmol/L 99 102  CO2 22 - 32 mmol/L 29 30  Calcium 8.9 - 10.3 mg/dL 9.3 9.3      Microbiology: Recent Results (from the past 240 hour(s))  SARS Coronavirus 2 by RT PCR (hospital order, performed in [facility] hospital lab)  Nasopharyngeal Nasopharyngeal Swab     Status: None   Collection Time: 06/03/19 10:47 AM   Specimen: Nasopharyngeal Swab  Result Value Ref Range Status   SARS Coronavirus 2 NEGATIVE NEGATIVE Final    Comment: (NOTE) SARS-CoV-2 target nucleic acids are NOT DETECTED. The SARS-CoV-2 RNA is generally detectable in upper and lower respiratory specimens during the acute phase of infection. The lowest concentration of SARS-CoV-2 viral copies this assay can detect is 250 copies / mL. A negative result does not preclude SARS-CoV-2 infection and should not be used as the sole basis for treatment or other patient management decisions.  A negative result may occur with improper specimen collection / handling, submission of specimen other than nasopharyngeal swab, presence of viral mutation(s) within the areas targeted by this assay, and inadequate number of viral copies (<250 copies / mL). A negative result must be combined with clinical observations, patient history, and epidemiological information. Fact Sheet for Patients:   BoilerBrush.com.cy Fact Sheet for Healthcare Providers: https://pope.com/ This test is not yet approved or cleared  by the [place] FDA and has been authorized for detection and/or diagnosis of SARS-CoV-2 by FDA under an Emergency Use Authorization (EUA).  This EUA will remain in effect (meaning this test can be used) for the duration of the COVID-19 declaration under Section 564(b)(1) of the Act, 21 U.S.C. section 360bbb-3(b)(1), unless the authorization is terminated or revoked sooner. Performed at [facility], [address] [place], [place] [address]   MRSA PCR Screening     Status: Abnormal   Collection Time: 06/03/19  3:29 PM   Specimen: Nasal Mucosa; Nasopharyngeal  Result Value Ref Range Status   MRSA by PCR POSITIVE (A) NEGATIVE Final    Comment:        The GeneXpert MRSA Assay (FDA approved for  NASAL specimens only), is one component of a comprehensive MRSA colonization surveillance program. It is not intended to diagnose MRSA infection nor to  guide or monitor treatment for MRSA infections. RESULT CALLED TO, READ BACK BY AND VERIFIED WITH: BERHUN,A 1656 ON 06/03/2019 BY MOSLEY,J Performed at [facility], [address] [place], [place] [address]     IMAGING RESULTS:  Nonspecific soft tissue edema of the left side of the face consistent with left facial cellulitis. No evidence of drainable abscess. Facial inflammation probably derives from dental disease of the left mandibular canine tooth, the sole remaining left-sided tooth. I have personally reviewed the films ? Impression/Recommendation ?Left-sided facial swelling, left submandibular swelling, lower lip swelling  Differential diagnosis is infectious etiology versus angioedema Things that favor infection as she had a preceding pain in the left ear and has a bad tooth. Things that favor angioedema is a recent history of uvulitis 2 months ago for which she came to the emergency department and took steroids.  She does not have any erythema or fever or white count.  Also acute in onset She is also on lisinopril. ? CT scan is nonspecific edema.  They do not comment on perio dental abscess or infection.  Will discuss with the radiologist tomorrow.  ?She is currently on clindamycin. will add ceftriaxone. Seen by ENT  Diabetes mellitus: Management as per primary team  Hypertension: Recommend not using ACE inhibitor's.  ___________________________________________________ Discussed with patient, requesting provider Note:  This document was prepared using Dragon voice recognition software and may include unintentional dictation errors.

## 2019-06-03 NOTE — ED Notes (Signed)
CBG rechecked after snack, 81 now. Pt eating lunch.

## 2019-06-03 NOTE — Consult Note (Signed)
[facility] PCCM Chalee, Hirota [id number] 1965/06/05 Veronica Shutters, MD  Reason for Consult: facial cellulitis  CC face and LIP swelling  HPI   54 y.o. female presents to ER for 1 day history of lower lip swelling.  +with lower mandibular tooth in past and reports pain surrounding the area.    Given acute onset, CT scan was performed in ER that is consistent with cellulitis from odontogenic source.    Continues to worsen this morning.  Has associated jaw pain, nonradiating.    Denies difficulty swallowing or breathing.  No chest pain shortness of breath or cough.    History of lisinopril but has not taken in 1 week.     Review of Systems:  Gen:  Denies  fever, sweats, chills weight loss  HEENT: Denies blurred vision, double vision, ear pain, eye pain, hearing loss, nose bleeds, sore throat Cardiac:  No dizziness, chest pain or heaviness, chest tightness,edema, No JVD Resp:   No cough, -sputum production, -shortness of breath,-wheezing, -hemoptysis,  Gi: Denies swallowing difficulty, stomach pain, nausea or vomiting, diarrhea, constipation, bowel incontinence Gu:  Denies bladder incontinence, burning urine Ext:   Denies Joint pain, stiffness or swelling Skin: Denies  skin rash, easy bruising or bleeding or hives Endoc:  Denies polyuria, polydipsia , polyphagia or weight change Psych:   Denies depression, insomnia or hallucinations  Other:  All other systems negative   Allergies:  Allergies  Allergen Reactions  . Aspirin Other (See Comments)    Makes her feel cold     ROS: Review of systems normal other than 12 systems except per HPI.  PMH:  Past Medical History:  Diagnosis Date  . Diabetes mellitus without complication ([facility])   . Facial cellulitis 06/03/2019  . Hypertension     FH: No family history on file.  SH:  Social History   Socioeconomic History  . Marital status: Single    Spouse name: Not on file  . Number of children: Not on file  . Years of  education: Not on file  . Highest education level: Not on file  Occupational History  . Not on file  Tobacco Use  . Smoking status: Never Smoker  . Smokeless tobacco: Never Used  Substance and Sexual Activity  . Alcohol use: Not on file  . Drug use: Not on file  . Sexual activity: Not on file  Other Topics Concern  . Not on file  Social History Narrative  . Not on file   Social Determinants of Health   Financial Resource Strain:   . Difficulty of Paying Living Expenses:   Food Insecurity:   . Worried About Programme researcher, broadcasting/film/video in the Last Year:   . Barista in the Last Year:   Transportation Needs:   . Freight forwarder (Medical):   Marland Kitchen Lack of Transportation (Non-Medical):   Physical Activity:   . Days of Exercise per Week:   . Minutes of Exercise per Session:   Stress:   . Feeling of Stress :   Social Connections:   . Frequency of Communication with Friends and Family:   . Frequency of Social Gatherings with Friends and Family:   . Attends Religious Services:   . Active Member of Clubs or Organizations:   . Attends Banker Meetings:   Marland Kitchen Marital Status:   Intimate Partner Violence:   . Fear of Current or Ex-Partner:   . Emotionally Abused:   Marland Kitchen Physically Abused:   . Sexually  Abused:     PSH: History reviewed. No pertinent surgical history.   Physical Examination:   General Appearance: No distress  Neuro:without focal findings,  speech normal,  HEENT: FACIAL SWELLING LIP SWELLING NO STRIDOR Pulmonary: normal breath sounds, No wheezing.  CardiovascularNormal S1,S2.  No m/r/g.   Abdomen: Benign, Soft, non-tender. Renal:  No costovertebral tenderness  GU:  Not performed at this time. Endoc: No evident thyromegaly Skin:   warm, no rashes, no ecchymosis  Extremities: normal, no cyanosis, clubbing. PSYCHIATRIC: Mood, affect within normal limits.    CT HEAD/NECK  Soft tissue swelling of left lower lip and mandible and peri-apical  abscess of tooth  A/Plan +TOOTH ABSCESS AND FACIAL SWELLING THIS IS NOT ANDIOEDEMA FOLLOW UP ENT RECS   PATIENT CAN BE TRANSFERRED OUT OF ICU NO ICU OR SD NEEDS A THIS TIME  Vital signs reviewed, ICU needs resolved  Will sign off at this time. No further recommendations at this time.  Please call [phone] for further questions. Thank you.    Corrin Parker, M.D.  [facility]  Medical Director [place] Director [facility] Cardio-Pulmonary Department    Veronica Lewis Patricia Pesa, M.D.  [facility]  Medical Director [place] Director [facility] Cardio-Pulmonary Department

## 2019-06-03 NOTE — H&P (Addendum)
History and Physical    Veronica Lewis WUJ:[id number] DOB: 02-Aug-1965 DOA: 06/03/2019  PCP: [facility]   Patient coming from: Home  I have personally briefly reviewed patient's old medical records in [facility]  Chief Complaint: Facial swelling  HPI: Veronica Lewis is a 54 y.o. female with medical history significant for hypertension and diabetes mellitus who presents to the emergency room for evaluation of swelling involving the left side of her face and lip which started about 5 PM yesterday and has continued to worsen since then. Patient states that she had oyster stew and some fish yesterday prior to this starting.  Swelling is associated with jaw/left ear pain that she rates 10 x 10 in intensity at its worst.  She denies having any difficulty swallowing or breathing.  Patient is on lisinopril but last took a dose about 1 week ago.  She complains about dental pain mostly on the left side. She denies having any fever or chills, she denies having any neck pain or stiffness.    She denies having any chest pain, shortness of breath, nausea, vomiting, dizziness, lightheadedness, urinary symptoms or changes in her bowel habits. Patient had a facial CT which showed left facial cellulitis. No CT evidence of drainable abscess. 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 She has a white count of 11.6  ED Course: Patient seen in the emergency room for evaluation of swelling, tenderness and firmness of the left lower lip, jaw and neck.  No airway involvement.  Angioedema suspected and patient is on lisinopril but last took a dose a week ago.  CT scan of the neck showed facial cellulitis.  Patient received a dose of Unasyn in the emergency room.  Review of Systems: As per HPI otherwise 10 point review of systems negative.    Past Medical History:  Diagnosis Date  . Diabetes mellitus  without complication (HCC)   . Facial cellulitis 06/03/2019  . Hypertension     History reviewed. No pertinent surgical history.   reports that she has never smoked. She has never used smokeless tobacco. No history on file for alcohol and drug.  Allergies  Allergen Reactions  . Aspirin Other (See Comments)    Makes her feel cold     No family history on file.   Prior to Admission medications   Medication Sig Start Date End Date Taking? Authorizing Provider  lisinopril (ZESTRIL) 20 MG tablet Take 20 mg by mouth daily.    [provider]  metFORMIN (GLUCOPHAGE) 500 MG tablet Take 500 mg by mouth 2 (two) times daily with a meal.    [provider]  predniSONE (DELTASONE) 10 MG tablet Take 6 tablets  today, on day 2 take 5 tablets, day 3 take 4 tablets, day 4 take 3 tablets, day 5 take  2 tablets and 1 tablet the last day 04/09/19   Tommi Rumps, PA-C    Physical Exam: Vitals:   06/03/19 1030 06/03/19 1242 06/03/19 1256 06/03/19 1300  BP:  (!) 150/100  (!) 164/91  Pulse: 100 (!) 106  100  Resp:      Temp:   98.7 F (37.1 C)   TempSrc:   Oral   SpO2: 98% 98%  98%  Weight:      Height:         Vitals:   06/03/19 1030 06/03/19 1242 06/03/19 1256 06/03/19 1300  BP:  Marland Kitchen)  150/100  (!) 164/91  Pulse: 100 (!) 106  100  Resp:      Temp:   98.7 F (37.1 C)   TempSrc:   Oral   SpO2: 98% 98%  98%  Weight:      Height:        Constitutional: NAD, alert and oriented x 3, swelling involving the lips and extending to the left side of the face, differential warmth Eyes: PERRL, lids and conjunctivae normal, pale conjunctiva ENMT: Mucous membranes are moist.  Neck: normal, supple, no masses, no thyromegaly Respiratory: clear to auscultation bilaterally, no wheezing, no crackles. Normal respiratory effort. No accessory muscle use.  Cardiovascular: Regular rate and rhythm, no murmurs / rubs / gallops. No extremity edema. 2+ pedal pulses. No carotid bruits.    Abdomen: no tenderness, no masses palpated. No hepatosplenomegaly. Bowel sounds positive.  Musculoskeletal: no clubbing / cyanosis. No joint deformity upper and lower extremities.  Skin: no rashes, lesions, ulcers.  Neurologic: No gross focal neurologic deficit. Psychiatric: Normal mood and affect.   Labs on Admission: I have personally reviewed following labs and imaging studies  CBC: Recent Labs  Lab 06/03/19 0814  WBC 11.6*  NEUTROABS 9.8*  HGB 10.7*  HCT 34.9*  MCV 91.8  PLT 062   Basic Metabolic Panel: Recent Labs  Lab 06/03/19 0814  NA 139  K 3.3*  CL 99  CO2 29  GLUCOSE 140*  BUN 9  CREATININE 0.97  CALCIUM 9.3   GFR: Estimated Creatinine Clearance: 70.4 mL/min (by C-G formula based on SCr of 0.97 mg/dL). Liver Function Tests: No results for input(s): AST, ALT, ALKPHOS, BILITOT, PROT, ALBUMIN in the last 168 hours. No results for input(s): LIPASE, AMYLASE in the last 168 hours. No results for input(s): AMMONIA in the last 168 hours. Coagulation Profile: No results for input(s): INR, PROTIME in the last 168 hours. Cardiac Enzymes: No results for input(s): CKTOTAL, CKMB, CKMBINDEX, TROPONINI in the last 168 hours. BNP (last 3 results) No results for input(s): PROBNP in the last 8760 hours. HbA1C: No results for input(s): HGBA1C in the last 72 hours. CBG: Recent Labs  Lab 06/03/19 1232 06/03/19 1250  GLUCAP 66* 81   Lipid Profile: No results for input(s): CHOL, HDL, LDLCALC, TRIG, CHOLHDL, LDLDIRECT in the last 72 hours. Thyroid Function Tests: No results for input(s): TSH, T4TOTAL, FREET4, T3FREE, THYROIDAB in the last 72 hours. Anemia Panel: No results for input(s): VITAMINB12, FOLATE, FERRITIN, TIBC, IRON, RETICCTPCT in the last 72 hours. Urine analysis: No results found for: COLORURINE, APPEARANCEUR, LABSPEC, PHURINE, GLUCOSEU, HGBUR, BILIRUBINUR, KETONESUR, PROTEINUR, UROBILINOGEN, NITRITE, LEUKOCYTESUR  Radiological Exams on Admission: CT  Soft Tissue Neck W Contrast  Result Date: 06/03/2019 CLINICAL DATA:  Left-sided facial swelling over the last day. EXAM: CT NECK WITH CONTRAST TECHNIQUE: Multidetector CT imaging of the neck was performed using the standard protocol following the bolus administration of intravenous contrast. CONTRAST:  80mL OMNIPAQUE IOHEXOL 300 MG/ML  SOLN COMPARISON:  None. FINDINGS: Pharynx and larynx: No mucosal or submucosal lesion. Salivary glands: Parotid and submandibular glands are normal. Thyroid: Normal Lymph nodes: No enlarged or low-density nodes on either side of the neck. Vascular: Normal. Carotid arteries are tortuous, swinging to midline behind the oropharynx. Limited intracranial: Normal Visualized orbits: Normal Mastoids and visualized paranasal sinuses: No acute sinus disease. 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 Skeleton: Ordinary cervical spondylosis. Upper chest: Negative Other: Nonspecific soft tissue edema of the left side of the  face consistent with left facial cellulitis. No evidence of drainable abscess. Facial inflammation probably derives from dental disease of the left mandibular canine tooth, the sole remaining left-sided tooth. IMPRESSION: Left facial cellulitis. No CT evidence of drainable abscess. 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 Electronically Signed   By: Paulina Fusi M.D.   On: 06/03/2019 09:20    EKG: Independently reviewed.  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 She also had oyster stew which she had never had before Past swelling started acutely at 5 PM and has progressively worsened since then We will keep patient n.p.o. for now Start patient on IV Decadron 4 mg every 8 Will admit patient to stepdown and monitor closely for airway compromise   Hypertension We will start patient on hydralazine 10 mg IV every 6 for systolic blood pressure greater than   Diabetes mellitus Maintain consistent carbohydrate diet Glycemic control with sliding scale coverage   Hypokalemia Supplement potassium    Morbid Obesity (BMI 44.7 kg/m2) Complicates overall prognosis and care    DVT prophylaxis: Lovenox Code Status: Full Family Communication: Greater than 50% of time was spent discussing plan of care with patient at the bedside.  All questions and concerns have been addressed.  She verbalizes understanding and agrees with the plan. Disposition Plan: Back to previous home environment Consults called: ENT, Critical Care, Infectious Disease    Kaelei Wheeler MD [facility]     06/03/2019, 1:24 PM

## 2019-06-04 LAB — GLUCOSE, CAPILLARY
Glucose-Capillary: 125 mg/dL — ABNORMAL HIGH (ref 70–99)
Glucose-Capillary: 92 mg/dL (ref 70–99)
Glucose-Capillary: 150 mg/dL — ABNORMAL HIGH (ref 70–99)

## 2019-06-04 LAB — CBC
HCT: 31.1 % — ABNORMAL LOW (ref 36.0–46.0)
Hemoglobin: 9.4 g/dL — ABNORMAL LOW (ref 12.0–15.0)
MCH: 28.3 pg (ref 26.0–34.0)
MCHC: 30.2 g/dL (ref 30.0–36.0)
MCV: 93.7 fL (ref 80.0–100.0)
Platelets: 355 10*3/uL (ref 150–400)
RBC: 3.32 MIL/uL — ABNORMAL LOW (ref 3.87–5.11)
RDW: 18.1 % — ABNORMAL HIGH (ref 11.5–15.5)
WBC: 11.4 10*3/uL — ABNORMAL HIGH (ref 4.0–10.5)
nRBC: 0 % (ref 0.0–0.2)

## 2019-06-04 LAB — BASIC METABOLIC PANEL
Anion gap: 10 (ref 5–15)
BUN: 8 mg/dL (ref 6–20)
CO2: 26 mmol/L (ref 22–32)
Calcium: 8.9 mg/dL (ref 8.9–10.3)
Chloride: 105 mmol/L (ref 98–111)
Creatinine, Ser: 0.67 mg/dL (ref 0.44–1.00)
GFR calc Af Amer: >60 mL/min (ref >60)
GFR calc non Af Amer: >60 mL/min (ref >60)
Glucose, Bld: 133 mg/dL — ABNORMAL HIGH (ref 70–99)
Potassium: 4 mmol/L (ref 3.5–5.1)
Sodium: 141 mmol/L (ref 135–145)

## 2019-06-04 LAB — HEMOGLOBIN A1C
Hgb A1c MFr Bld: 6.8 % — ABNORMAL HIGH (ref 4.8–5.6)
Mean Plasma Glucose: 148.46 mg/dL

## 2019-06-04 MED ORDER — SPIRONOLACTONE 25 MG PO TABS
25.0000 mg | ORAL_TABLET | Freq: Every day | ORAL | Status: DC
  Administered 2019-06-04 – 2019-06-05 (×2): 25 mg via ORAL
  Filled 2019-06-04 (×2): qty 1

## 2019-06-04 MED ORDER — KETOROLAC TROMETHAMINE 15 MG/ML IJ SOLN
15.0000 mg | Freq: Three times a day (TID) | INTRAMUSCULAR | Status: DC | PRN
  Administered 2019-06-04: 15 mg via INTRAVENOUS
  Filled 2019-06-04 (×3): qty 1

## 2019-06-04 MED ORDER — GABAPENTIN 300 MG PO CAPS
300.0000 mg | ORAL_CAPSULE | Freq: Every day | ORAL | Status: DC
  Administered 2019-06-04: 23:00:00 300 mg via ORAL
  Filled 2019-06-04: qty 1

## 2019-06-04 MED ORDER — DOXEPIN HCL 10 MG PO CAPS
10.0000 mg | ORAL_CAPSULE | Freq: Every day | ORAL | Status: DC
  Administered 2019-06-04: 23:00:00 10 mg via ORAL
  Filled 2019-06-04 (×2): qty 1

## 2019-06-04 MED ORDER — AMLODIPINE BESYLATE 10 MG PO TABS
10.0000 mg | ORAL_TABLET | Freq: Every day | ORAL | Status: DC
  Administered 2019-06-04 – 2019-06-05 (×2): 10 mg via ORAL
  Filled 2019-06-04 (×2): qty 1

## 2019-06-04 MED ORDER — ACETAMINOPHEN 500 MG PO TABS: 1000.0000 mg | ORAL_TABLET | Freq: Three times a day (TID) | ORAL | Status: DC | PRN

## 2019-06-04 MED ORDER — SODIUM CHLORIDE 0.9 % IV SOLN
1.5000 g | Freq: Four times a day (QID) | INTRAVENOUS | Status: DC
  Administered 2019-06-04 – 2019-06-05 (×4): 1.5 g via INTRAVENOUS
  Filled 2019-06-04 (×3): qty 1.5
  Filled 2019-06-04 (×3): qty 4
  Filled 2019-06-04: qty 1.5

## 2019-06-04 NOTE — Progress Notes (Signed)
Patient up beat, stated felling better,emotional support

## 2019-06-04 NOTE — Progress Notes (Signed)
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 female with medical history significant for hypertension and diabetes mellitus who presents to the emergency room for evaluation of swelling involving the left side of her face and lip which started about 5 PM yesterday and has continued to worsen since then. Patient states that she had oyster stew and some fish yesterday prior to this starting.  Swelling is associated with jaw/left ear pain that she rates 10 x 10 in intensity at its worst.  She denies having any difficulty swallowing or breathing.  Patient is on lisinopril but last took a dose about 1 week ago.  She complains about dental pain mostly on the left side. She denies having any fever or chills, she denies having any neck pain or stiffness.    Left Facial cellulitis, improved Patient presents with lip swelling and left-sided facial swelling with differential warmth.  Likely dental source.  Was started on IV clindamycin 600 mg every 8 and ceftriaxone, per ID consult. PLAN: --switch abx to Unasyn --If continue to improve, may discharge tomorrow with Augmentin.  Angioedema, ruled out Patient was on lisinopril.  She also had oyster stew which she had never had before.  ENT and critical care both consulted and ruled out angioedema. --swelling improved with steroid  Hypertension --resume home amlodipine and aldactone --hold home Lisinopril just to be safe  Diabetes mellitus --hold home metformin --SSI TID --regular diet, per pt request  Hypokalemia Supplement potassium  Morbid Obesity (BMI 05.3 kg/m2) Complicates overall  prognosis and care   DVT prophylaxis: Lovenox SQ Code Status: Full code  Family Communication:  Status is: inpatient Dispo:   The patient is from: home Anticipated d/c is to: home Anticipated d/c date is: tomorrow Patient currently is not medically stable to d/c due to: Still need IV abx.  Plan to transition to oral abx tomorrow.   Subjective and Interval History:  Pt reported feeling better, face swelling improved, wanted to eat solid foods.  No fever, dyspnea, chest pain, abdominal pain, N/V/D, dysuria.    Objective: Vitals:   06/04/19 0625 06/04/19 0630 06/04/19 0643 06/04/19 0801  BP: 126/71   139/68  Pulse: 91 92 80 91  Resp: (!) 27 (!) 26 19 19   Temp: 98.2 F (36.8 C)   (!) 97.4 F (36.3 C)  TempSrc: Oral   Oral  SpO2: 98% 100% 98% 98%  Weight:      Height:        Intake/Output Summary (Last 24 hours) at 06/04/2019 1623 Last data filed at 06/04/2019 0900 Gross per 24 hour  Intake 1541.33 ml  Output 575 ml  Net 966.33 ml   Filed Weights   06/03/19 0725 06/03/19 1517  Weight: 103.9 kg 103.7 kg    Examination:   Constitutional: NAD, AAOx3 HEENT: conjunctivae and lids normal, EOMI.  Left side of her face more swollen, though improved from yesterday. CV: RRR no M,R,G. Distal pulses +2.  No cyanosis.   RESP: CTA B/L, normal respiratory effort  GI: +BS, NTND Extremities: No effusions, edema, or tenderness in BLE MSK: normal ROM and strength, no joint enlargement or  tenderness of both UE and LE SKIN: warm, dry and intact Neuro: II - XII grossly intact.  Sensation intact Psych: Normal mood and affect.  Appropriate judgement and reason   Data Reviewed: I have personally reviewed following labs and imaging studies  CBC: Recent Labs  Lab 06/03/19 0814 06/04/19 0353  WBC 11.6* 11.4*  NEUTROABS 9.8*  --   HGB 10.7* 9.4*  HCT 34.9* 31.1*  MCV 91.8 93.7  PLT 349 355   Basic Metabolic Panel: Recent Labs  Lab 06/03/19 0814 06/04/19 0353  NA 139 141  K  3.3* 4.0  CL 99 105  CO2 29 26  GLUCOSE 140* 133*  BUN 9 8  CREATININE 0.97 0.67  CALCIUM 9.3 8.9   GFR: Estimated Creatinine Clearance: 85.3 mL/min (by C-G formula based on SCr of 0.67 mg/dL). Liver Function Tests: No results for input(s): AST, ALT, ALKPHOS, BILITOT, PROT, ALBUMIN in the last 168 hours. No results for input(s): LIPASE, AMYLASE in the last 168 hours. No results for input(s): AMMONIA in the last 168 hours. Coagulation Profile: No results for input(s): INR, PROTIME in the last 168 hours. Cardiac Enzymes: No results for input(s): CKTOTAL, CKMB, CKMBINDEX, TROPONINI in the last 168 hours. BNP (last 3 results) No results for input(s): PROBNP in the last 8760 hours. HbA1C: Recent Labs    06/03/19 1245  HGBA1C 6.8*   CBG: Recent Labs  Lab 06/03/19 1705 06/03/19 2019 06/03/19 2321 06/04/19 0758 06/04/19 1156  GLUCAP 162* 128* 133* 125* 92   Lipid Profile: No results for input(s): CHOL, HDL, LDLCALC, TRIG, CHOLHDL, LDLDIRECT in the last 72 hours. Thyroid Function Tests: No results for input(s): TSH, T4TOTAL, FREET4, T3FREE, THYROIDAB in the last 72 hours. Anemia Panel: No results for input(s): VITAMINB12, FOLATE, FERRITIN, TIBC, IRON, RETICCTPCT in the last 72 hours. Sepsis Labs: No results for input(s): PROCALCITON, LATICACIDVEN in the last 168 hours.  Recent Results (from the past 240 hour(s))  SARS Coronavirus 2 by RT PCR (hospital order, performed in [facility] hospital lab) Nasopharyngeal Nasopharyngeal Swab     Status: None   Collection Time: 06/03/19 10:47 AM   Specimen: Nasopharyngeal Swab  Result Value Ref Range Status   SARS Coronavirus 2 NEGATIVE NEGATIVE Final    Comment: (NOTE) SARS-CoV-2 target nucleic acids are NOT DETECTED. The SARS-CoV-2 RNA is generally detectable in upper and lower respiratory specimens during the acute phase of infection. The lowest concentration of SARS-CoV-2 viral copies this assay can detect is 250 copies / mL. A  negative result does not preclude SARS-CoV-2 infection and should not be used as the sole basis for treatment or other patient management decisions.  A negative result may occur with improper specimen collection / handling, submission of specimen other than nasopharyngeal swab, presence of viral mutation(s) within the areas targeted by this assay, and inadequate number of viral copies (<250 copies / mL). A negative result must be combined with clinical observations, patient history, and epidemiological information. Fact Sheet for Patients:   BoilerBrush.com.cy Fact Sheet for Healthcare Providers: https://pope.com/ This test is not yet approved or cleared  by the [place] FDA and has been authorized for detection and/or diagnosis of SARS-CoV-2 by FDA under an Emergency Use Authorization (EUA).  This EUA will remain in effect (meaning this test can be used) for the duration of the COVID-19 declaration under Section 564(b)(1) of the Act, 21 U.S.C. section 360bbb-3(b)(1), unless the authorization is terminated or revoked sooner. Performed at [facility], [address] [place],  [place] [address]   MRSA PCR Screening     Status: Abnormal   Collection Time: 06/03/19  3:29 PM   Specimen: Nasal Mucosa; Nasopharyngeal  Result Value Ref Range Status   MRSA by PCR POSITIVE (A) NEGATIVE Final    Comment:        The GeneXpert MRSA Assay (FDA approved for NASAL specimens only), is one component of a comprehensive MRSA colonization surveillance program. It is not intended to diagnose MRSA infection nor to guide or monitor treatment for MRSA infections. RESULT CALLED TO, READ BACK BY AND VERIFIED WITH: BERHUN,A 1656 ON 06/03/2019 BY MOSLEY,J Performed at [facility], [address] [place], [place] [address]   Culture, blood (Routine X 2) w Reflex to ID Panel     Status: None (Preliminary result)   Collection Time:  06/03/19  9:18 PM   Specimen: BLOOD  Result Value Ref Range Status   Specimen Description   Final    BLOOD Blood Culture results may not be optimal due to an inadequate volume of blood received in culture bottles   Special Requests   Final    BOTTLES DRAWN AEROBIC AND ANAEROBIC LEFT ANTECUBITAL   Culture   Final    NO GROWTH < 12 HOURS Performed at [facility], [address] [place], [place] [address]    Report Status PENDING  Incomplete  Culture, blood (Routine X 2) w Reflex to ID Panel     Status: None (Preliminary result)   Collection Time: 06/03/19  9:18 PM   Specimen: BLOOD  Result Value Ref Range Status   Specimen Description   Final    BLOOD Blood Culture results may not be optimal due to an inadequate volume of blood received in culture bottles   Special Requests   Final    BOTTLES DRAWN AEROBIC AND ANAEROBIC BLOOD LEFT HAND   Culture   Final    NO GROWTH < 12 HOURS Performed at [facility], [address] [place], [place] [address]    Report Status PENDING  Incomplete      Radiology Studies: CT Soft Tissue Neck W Contrast  Result Date: 06/03/2019 CLINICAL DATA:  Left-sided facial swelling over the last day. EXAM: CT NECK WITH CONTRAST TECHNIQUE: Multidetector CT imaging of the neck was performed using the standard protocol following the bolus administration of intravenous contrast. CONTRAST:  38mL OMNIPAQUE IOHEXOL 300 MG/ML  SOLN COMPARISON:  None. FINDINGS: Pharynx and larynx: No mucosal or submucosal lesion. Salivary glands: Parotid and submandibular glands are normal. Thyroid: Normal Lymph nodes: No enlarged or low-density nodes on either side of the neck. Vascular: Normal. Carotid arteries are tortuous, swinging to midline behind the oropharynx. Limited intracranial: Normal Visualized orbits: Normal Mastoids and visualized paranasal sinuses: No acute sinus disease. Chronic changes of the right maxillary sinus with previous nasoantral window  creation and ethmoidectomy. Skeleton: Ordinary cervical spondylosis. Upper chest: Negative Other: Nonspecific soft tissue edema of the left side of the face consistent with left facial cellulitis. No evidence of drainable abscess. Facial inflammation probably derives from dental disease of the left mandibular canine tooth, the sole remaining left-sided tooth. IMPRESSION: Left facial cellulitis. No CT evidence of drainable abscess. 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 Electronically Signed   By: Paulina Fusi M.D.   On: 06/03/2019 09:20     Scheduled Meds: . Chlorhexidine Gluconate Cloth  6 each Topical Q0600  . enoxaparin (LOVENOX)  injection  40 mg Subcutaneous BID  . insulin aspart  0-20 Units Subcutaneous TID WC  . lactobacillus  1 g Oral Daily  . mupirocin ointment  1 application Nasal BID   Continuous Infusions: . ampicillin-sulbactam (UNASYN) IV 1.5 g (06/04/19 1524)     LOS: 1 day     Darlin Priestly, MD [facility] If 7PM-7AM, please contact night-coverage 06/04/2019, 4:23 PM

## 2019-06-04 NOTE — Plan of Care (Signed)
  Problem: Education: Goal: Knowledge of General Education information will improve Description: Including pain rating scale, medication(s)/side effects and non-pharmacologic comfort measures Outcome: Progressing   Problem: Health Behavior/Discharge Planning: Goal: Ability to manage health-related needs will improve Outcome: Progressing   Problem: Clinical Measurements: Goal: Ability to maintain clinical measurements within normal limits will improve Outcome: Progressing Goal: Will remain free from infection Outcome: Progressing Goal: Diagnostic test results will improve Outcome: Progressing Goal: Respiratory complications will improve Outcome: Progressing Goal: Cardiovascular complication will be avoided Outcome: Progressing   Problem: Elimination: Goal: Will not experience complications related to bowel motility Outcome: Progressing Goal: Will not experience complications related to urinary retention Outcome: Progressing   Problem: Pain Managment: Goal: General experience of comfort will improve Outcome: Progressing   

## 2019-06-04 NOTE — Progress Notes (Signed)
ID Pt feeling a little better\ Swelling slight improvement\ Pain deep in the ear is still there  \ Patient Vitals for the past 24 hrs:  BP Temp Temp src Pulse Resp SpO2 Height Weight  06/04/19 0801 139/68 (!) 97.4 F (36.3 C) Oral 91 19 98 % -- --  06/04/19 0643 -- -- -- 80 19 98 % -- --  06/04/19 0630 -- -- -- 92 (!) 26 100 % -- --  06/04/19 0625 126/71 98.2 F (36.8 C) Oral 91 (!) 27 98 % -- --  06/04/19 0218 131/88 98.4 F (36.9 C) Oral 91 (!) 24 98 % -- --  06/04/19 0100 127/68 -- -- 97 (!) 23 94 % -- --  06/04/19 0000 138/87 98.2 F (36.8 C) Oral 96 (!) 25 97 % -- --  06/03/19 2300 120/83 -- -- 96 (!) 22 92 % -- --  06/03/19 2200 138/87 -- -- (!) 103 (!) 22 97 % -- --  06/03/19 2100 129/90 -- -- (!) 110 (!) 21 100 % -- --  06/03/19 2000 130/82 -- -- (!) 106 (!) 26 97 % -- --  06/03/19 1900 131/89 99.4 F (37.4 C) Oral (!) 107 (!) 27 96 % -- --  06/03/19 1800 (!) 136/91 -- -- (!) 119 (!) 25 98 % -- --  06/03/19 1700 136/82 -- -- (!) 110 (!) 28 100 % -- --  06/03/19 1622 (!) 157/96 -- -- (!) 112 (!) 23 99 % -- --  06/03/19 1600 (!) 186/104 -- -- (!) 108 20 96 % -- --  06/03/19 1517 -- 100.1 F (37.8 C) Oral -- -- -- 5' (1.524 m) 103.7 kg  06/03/19 1504 -- -- -- (!) 110 20 99 % -- --  06/03/19 1444 (!) 174/105 -- -- (!) 105 -- 100 % -- --  06/03/19 1425 (!) 177/118 -- -- -- -- -- -- --  06/03/19 1421 (!) 177/118 -- -- (!) 105 -- 100 % -- --  06/03/19 1400 (!) 180/116 -- -- (!) 106 -- 99 % -- --   O/E Awake and alert and does not look septic\ Face left side swollen, left submandibular  area swollen Tender on deep palpation Warmth\ But no erythema Lip swollen\ Pharynx normal , no erythema, tonsillar enlargement 5/13   5/14\      IMAGING RESULTS:  Nonspecific soft tissue edema of the left side of the face consistent with left facial cellulitis. No evidence of drainable abscess. Facial inflammation probably derives from dental disease of the left mandibular  canine tooth, the sole remaining left-sided tooth. I have personally reviewed the films ? Impression/Recommendation ?Left-sided facial swelling, left submandibular swelling, lower lip swelling  Differential diagnosis is infectious etiology versus angioedema Things that favor infection are :  she had a preceding pain in the left ear and has a bad tooth. Things that favor angioedema are: She does not have any erythema or fever or white count.  She is also on lisinopril. She ate a lot of seafood for 2 days . She has a  recent history of uvula edema  In March 2021  for which she came to the emergency department and took steroids.   ? CT scan is nonspecific edema.  They do not comment on periodental abscess or infection.   Change Clinda and ceftriaxone to unasyn so that on discharge tomorrow it could be switched to augmentin  Diabetes mellitus: Management as per primary team  Hypertension: Recommend not using ACE inhibitor.  Discussed the management  with patient and Dr.Lai

## 2019-06-05 LAB — BASIC METABOLIC PANEL
Anion gap: 7 (ref 5–15)
BUN: 9 mg/dL (ref 6–20)
CO2: 29 mmol/L (ref 22–32)
Calcium: 9.1 mg/dL (ref 8.9–10.3)
Chloride: 107 mmol/L (ref 98–111)
Creatinine, Ser: 0.67 mg/dL (ref 0.44–1.00)
GFR calc Af Amer: >60 mL/min (ref >60)
GFR calc non Af Amer: >60 mL/min (ref >60)
Glucose, Bld: 109 mg/dL — ABNORMAL HIGH (ref 70–99)
Potassium: 3.7 mmol/L (ref 3.5–5.1)
Sodium: 143 mmol/L (ref 135–145)

## 2019-06-05 LAB — CBC
HCT: 30.9 % — ABNORMAL LOW (ref 36.0–46.0)
Hemoglobin: 9.2 g/dL — ABNORMAL LOW (ref 12.0–15.0)
MCH: 28.2 pg (ref 26.0–34.0)
MCHC: 29.8 g/dL — ABNORMAL LOW (ref 30.0–36.0)
MCV: 94.8 fL (ref 80.0–100.0)
Platelets: 356 10*3/uL (ref 150–400)
RBC: 3.26 MIL/uL — ABNORMAL LOW (ref 3.87–5.11)
RDW: 18.2 % — ABNORMAL HIGH (ref 11.5–15.5)
WBC: 7.6 10*3/uL (ref 4.0–10.5)
nRBC: 0.3 % — ABNORMAL HIGH (ref 0.0–0.2)

## 2019-06-05 LAB — MAGNESIUM: Magnesium: 2.2 mg/dL (ref 1.7–2.4)

## 2019-06-05 LAB — GLUCOSE, CAPILLARY: Glucose-Capillary: 101 mg/dL — ABNORMAL HIGH (ref 70–99)

## 2019-06-05 MED ORDER — AMOXICILLIN-POT CLAVULANATE 875-125 MG PO TABS: 1.0000 | ORAL_TABLET | Freq: Two times a day (BID) | ORAL | 0 refills | Status: AC

## 2019-06-05 NOTE — Progress Notes (Signed)
Pt d/c to home via sister. IVs removed intact. VSS. Education completed. All questions answered. All belongings sent with pt.

## 2019-06-05 NOTE — Discharge Summary (Signed)
Physician Discharge Summary   Veronica Lewis  female DOB: 1965-05-28  XQJ:[id number]  PCP: [facility]  Admit date: 06/03/2019 Discharge date: 06/05/2019  Admitted From: home Disposition:  home CODE STATUS: Full code  Discharge Instructions    Discharge instructions   Complete by: As directed    You have received 2 days of IV antibiotic for your left facial cellulitis.  Since you are improving, Infectious Disease doctor and I think you can go home to finish 8 more days of oral antibiotic Augmentin.  Please be sure to get the bad tooth extracted as soon as possible.  Also, please stop taking Lisinopril just in case it was contributing to your lip and face swelling.   Dr. Darlin Priestly [facility] Course:  For full details, please see H&P, progress notes, consult notes and ancillary notes.  Briefly,  Veronica Lewis a 54 y.o.AA femalewith medical history significant forhypertension and diabetes mellitus who presented to the emergency room for evaluation of swelling involving the left side of her face and lip which started about 5 PM the day prior and had continued to worsensince then.  Patient stated that she had oyster stew and some fish yesterday prior to this starting.Swelling was associated with jaw/left earpain that she rated 10 x 10 in intensity at its worst. She denied having any difficulty swallowing or breathing.  Patient was on lisinopril.  She complained about dental pain mostly on the left side. She denied having any fever or chills, any neck pain or stiffness.    Left Facial cellulitis, improved Patient presented with lip swellingandleft-sided facial swelling with differential warmth.  Likely dental source.  Was started on IV clindamycin 600 mg every 8 and ceftriaxone, per ID consult. Pt was then switched to Unasyn.  After 2.5 days of IV abx, pt's facial swelling dramatically improved, so was deemed stable to discharge  home with 8 more days of Augmentin.  Angioedema, ruled out Patient was on lisinopril.  She also hadoyster stew which she had never had before.  ENT and critical care both consulted and ruled out angioedema.  Hypertension Continued home amlodipine and aldactone.  Home Lisinopril held just to be safe just in case pt did have an allergic rxn to Lisinopril.  Diabetes mellitus home metformin held during hospitalization and resumed after discharge.  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   amoxicillin-clavulanate 875-125 MG tablet Commonly known as: Augmentin Take 1 tablet by mouth 2 (two) times daily for 8 days. Antibiotic.   atorvastatin 40 MG tablet Commonly known as: LIPITOR Take 40 mg by mouth daily.   doxepin 10 MG capsule Commonly known as: SINEQUAN Take 10 mg by mouth at bedtime.   gabapentin 300 MG capsule  Commonly known as: NEURONTIN Take 300 mg by mouth at bedtime.   metFORMIN 500 MG tablet Commonly known as: GLUCOPHAGE Take 500 mg by mouth 2 (two) times daily with a meal.   spironolactone 25 MG tablet Commonly known as: ALDACTONE Take 25 mg by mouth daily.       Follow-up Information    [facility]. Schedule an appointment as soon as possible for a visit in 1 week(s).   Contact  information: [address] [facility] [place] [address] [phone]           Allergies  Allergen Reactions  . Aspirin Other (See Comments)    Makes her feel cold      The results of significant diagnostics from this hospitalization (including imaging, microbiology, ancillary and laboratory) are listed below for reference.   Consultations:   Procedures/Studies: CT Soft Tissue Neck W Contrast  Result Date: 06/03/2019 CLINICAL DATA:  Left-sided facial swelling over the last day. EXAM: CT NECK WITH CONTRAST TECHNIQUE: Multidetector CT imaging of the neck was performed using the standard protocol following the bolus administration of intravenous contrast. CONTRAST:  65mL OMNIPAQUE IOHEXOL 300 MG/ML  SOLN COMPARISON:  None. FINDINGS: Pharynx and larynx: No mucosal or submucosal lesion. Salivary glands: Parotid and submandibular glands are normal. Thyroid: Normal Lymph nodes: No enlarged or low-density nodes on either side of the neck. Vascular: Normal. Carotid arteries are tortuous, swinging to midline behind the oropharynx. Limited intracranial: Normal Visualized orbits: Normal Mastoids and visualized paranasal sinuses: No acute sinus disease. 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 Skeleton: Ordinary cervical spondylosis. Upper chest: Negative Other: Nonspecific soft tissue edema of the left side of the face consistent with left facial cellulitis. No evidence of drainable abscess. Facial inflammation probably derives from dental disease of the left mandibular canine tooth, the sole remaining left-sided tooth. IMPRESSION: Left facial cellulitis. No CT evidence of drainable abscess. 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 Electronically Signed   By: Paulina Fusi M.D.   On: 06/03/2019 09:20      Labs: BNP (last 3 results) No  results for input(s): BNP in the last 8760 hours. Basic Metabolic Panel: Recent Labs  Lab 06/03/19 0814 06/04/19 0353 06/05/19 0605  NA 139 141 143  K 3.3* 4.0 3.7  CL 99 105 107  CO2 29 26 29   GLUCOSE 140* 133* 109*  BUN 9 8 9   CREATININE 0.97 0.67 0.67  CALCIUM 9.3 8.9 9.1  MG  --   --  2.2   Liver Function Tests: No results for input(s): AST, ALT, ALKPHOS, BILITOT, PROT, ALBUMIN in the last 168 hours. No results for input(s): LIPASE, AMYLASE in the last 168 hours. No results for input(s): AMMONIA in the last 168 hours. CBC: Recent Labs  Lab 06/03/19 0814 06/04/19 0353 06/05/19 0605  WBC 11.6* 11.4* 7.6  NEUTROABS 9.8*  --   --   HGB 10.7* 9.4* 9.2*  HCT 34.9* 31.1* 30.9*  MCV 91.8 93.7 94.8  PLT 349 355 356   Cardiac Enzymes: No results for input(s): CKTOTAL, CKMB, CKMBINDEX, TROPONINI in the last 168 hours. BNP: Invalid input(s): POCBNP CBG: Recent Labs  Lab 06/03/19 2321 06/04/19 0758 06/04/19 1156 06/04/19 2034 06/05/19 0753  GLUCAP 133* 125* 92 150* 101*   D-Dimer No results for input(s): DDIMER in the last 72 hours. Hgb A1c Recent Labs  06/03/19 1245  HGBA1C 6.8*   Lipid Profile No results for input(s): CHOL, HDL, LDLCALC, TRIG, CHOLHDL, LDLDIRECT in the last 72 hours. Thyroid function studies No results for input(s): TSH, T4TOTAL, T3FREE, THYROIDAB in the last 72 hours.  Invalid input(s): FREET3 Anemia work up No results for input(s): VITAMINB12, FOLATE, FERRITIN, TIBC, IRON, RETICCTPCT in the last 72 hours. Urinalysis No results found for: COLORURINE, APPEARANCEUR, [place], [place], [place], [place], [place], [place], PROTEINUR, UROBILINOGEN, NITRITE, LEUKOCYTESUR Sepsis Labs Invalid input(s): PROCALCITONIN,  WBC,  LACTICIDVEN Microbiology Recent Results (from the past 240 hour(s))  SARS Coronavirus 2 by RT PCR (hospital order, performed in [facility] hospital lab) Nasopharyngeal Nasopharyngeal Swab     Status: None   Collection  Time: 06/03/19 10:47 AM   Specimen: Nasopharyngeal Swab  Result Value Ref Range Status   SARS Coronavirus 2 NEGATIVE NEGATIVE Final    Comment: (NOTE) SARS-CoV-2 target nucleic acids are NOT DETECTED. The SARS-CoV-2 RNA is generally detectable in upper and lower respiratory specimens during the acute phase of infection. The lowest concentration of SARS-CoV-2 viral copies this assay can detect is 250 copies / mL. A negative result does not preclude SARS-CoV-2 infection and should not be used as the sole basis for treatment or other patient management decisions.  A negative result may occur with improper specimen collection / handling, submission of specimen other than nasopharyngeal swab, presence of viral mutation(s) within the areas targeted by this assay, and inadequate number of viral copies (<250 copies / mL). A negative result must be combined with clinical observations, patient history, and epidemiological information. Fact Sheet for Patients:   StrictlyIdeas.no Fact Sheet for Healthcare Providers: BankingDealers.co.za This test is not yet approved or cleared  by the [place] FDA and has been authorized for detection and/or diagnosis of SARS-CoV-2 by FDA under an Emergency Use Authorization (EUA).  This EUA will remain in effect (meaning this test can be used) for the duration of the COVID-19 declaration under Section 564(b)(1) of the Act, 21 U.S.C. section 360bbb-3(b)(1), unless the authorization is terminated or revoked sooner. Performed at [facility], [address] [place], [place] [address]   MRSA PCR Screening     Status: Abnormal   Collection Time: 06/03/19  3:29 PM   Specimen: Nasal Mucosa; Nasopharyngeal  Result Value Ref Range Status   MRSA by PCR POSITIVE (A) NEGATIVE Final    Comment:        The GeneXpert MRSA Assay (FDA approved for NASAL specimens only), is one component of a comprehensive MRSA  colonization surveillance program. It is not intended to diagnose MRSA infection nor to guide or monitor treatment for MRSA infections. RESULT CALLED TO, READ BACK BY AND VERIFIED WITH: BERHUN,A [id number] ON 06/03/2019 BY MOSLEY,J Performed at [facility], [address] [place], [place] [address]   Culture, blood (Routine X 2) w Reflex to ID Panel     Status: None (Preliminary result)   Collection Time: 06/03/19  9:18 PM   Specimen: BLOOD  Result Value Ref Range Status   Specimen Description   Final    BLOOD Blood Culture results may not be optimal due to an inadequate volume of blood received in culture bottles   Special Requests   Final    BOTTLES DRAWN AEROBIC AND ANAEROBIC LEFT ANTECUBITAL   Culture   Final    NO GROWTH 2 DAYS Performed at [facility], [address] [place], [place] [address]    Report Status PENDING  Incomplete  Culture, blood (Routine X 2) w Reflex to ID Panel     Status: None (Preliminary result)   Collection Time: 06/03/19  9:18 PM   Specimen: BLOOD  Result Value Ref Range Status   Specimen Description   Final    BLOOD Blood Culture results may not be optimal due to an inadequate volume of blood received in culture bottles   Special Requests   Final    BOTTLES DRAWN AEROBIC AND ANAEROBIC BLOOD LEFT HAND   Culture   Final    NO GROWTH 2 DAYS Performed at [facility], [address] [place], [place] [address]    Report Status PENDING  Incomplete     Total time spend on discharging this patient, including the last patient exam, discussing the hospital stay, instructions for ongoing care as it relates to all pertinent caregivers, as well as preparing the medical discharge records, prescriptions, and/or referrals as applicable, is 35 minutes.    Darlin Priestly, MD  [facility] 06/05/2019, 8:13 AM  If 7PM-7AM, please contact night-coverage

## 2019-06-08 LAB — CULTURE, BLOOD (ROUTINE X 2)
Culture: NO GROWTH
Culture: NO GROWTH

## 2020-04-10 ENCOUNTER — Other Ambulatory Visit: Payer: Self-pay

## 2020-08-09 ENCOUNTER — Ambulatory Visit
Admission: RE | Admit: 2020-08-09 | Discharge: 2020-08-09 | Disposition: A | Discharge status: Home / Self Care | Payer: 59 | Source: Ambulatory Visit | Attending: Family Medicine | Admitting: Family Medicine

## 2020-08-09 ENCOUNTER — Other Ambulatory Visit: Payer: Self-pay

## 2020-08-09 ENCOUNTER — Ambulatory Visit
Admission: RE | Admit: 2020-08-09 | Discharge: 2020-08-09 | Disposition: A | Discharge status: Home / Self Care | Payer: 59 | Attending: Family Medicine | Admitting: Family Medicine

## 2020-08-09 ENCOUNTER — Other Ambulatory Visit: Payer: Self-pay | Admitting: Family Medicine

## 2020-08-09 DIAGNOSIS — M79671 Pain in right foot: Secondary | ICD-10-CM

## 2021-07-17 IMAGING — CR DG CHEST 2V
1 series · 2 of 2 positions shown · non-contrast
Comparison: No pertinent prior studies available for comparison.

CLINICAL DATA: Wheezing, no history of asthma or smoking.
Additional history provided: Patient reports tonsillar swelling.

EXAM:
CHEST - 2 VIEW

[Series 1: w chest pa · 0.14mm/px · 2 of 2 slices shown]
[im 1/2]
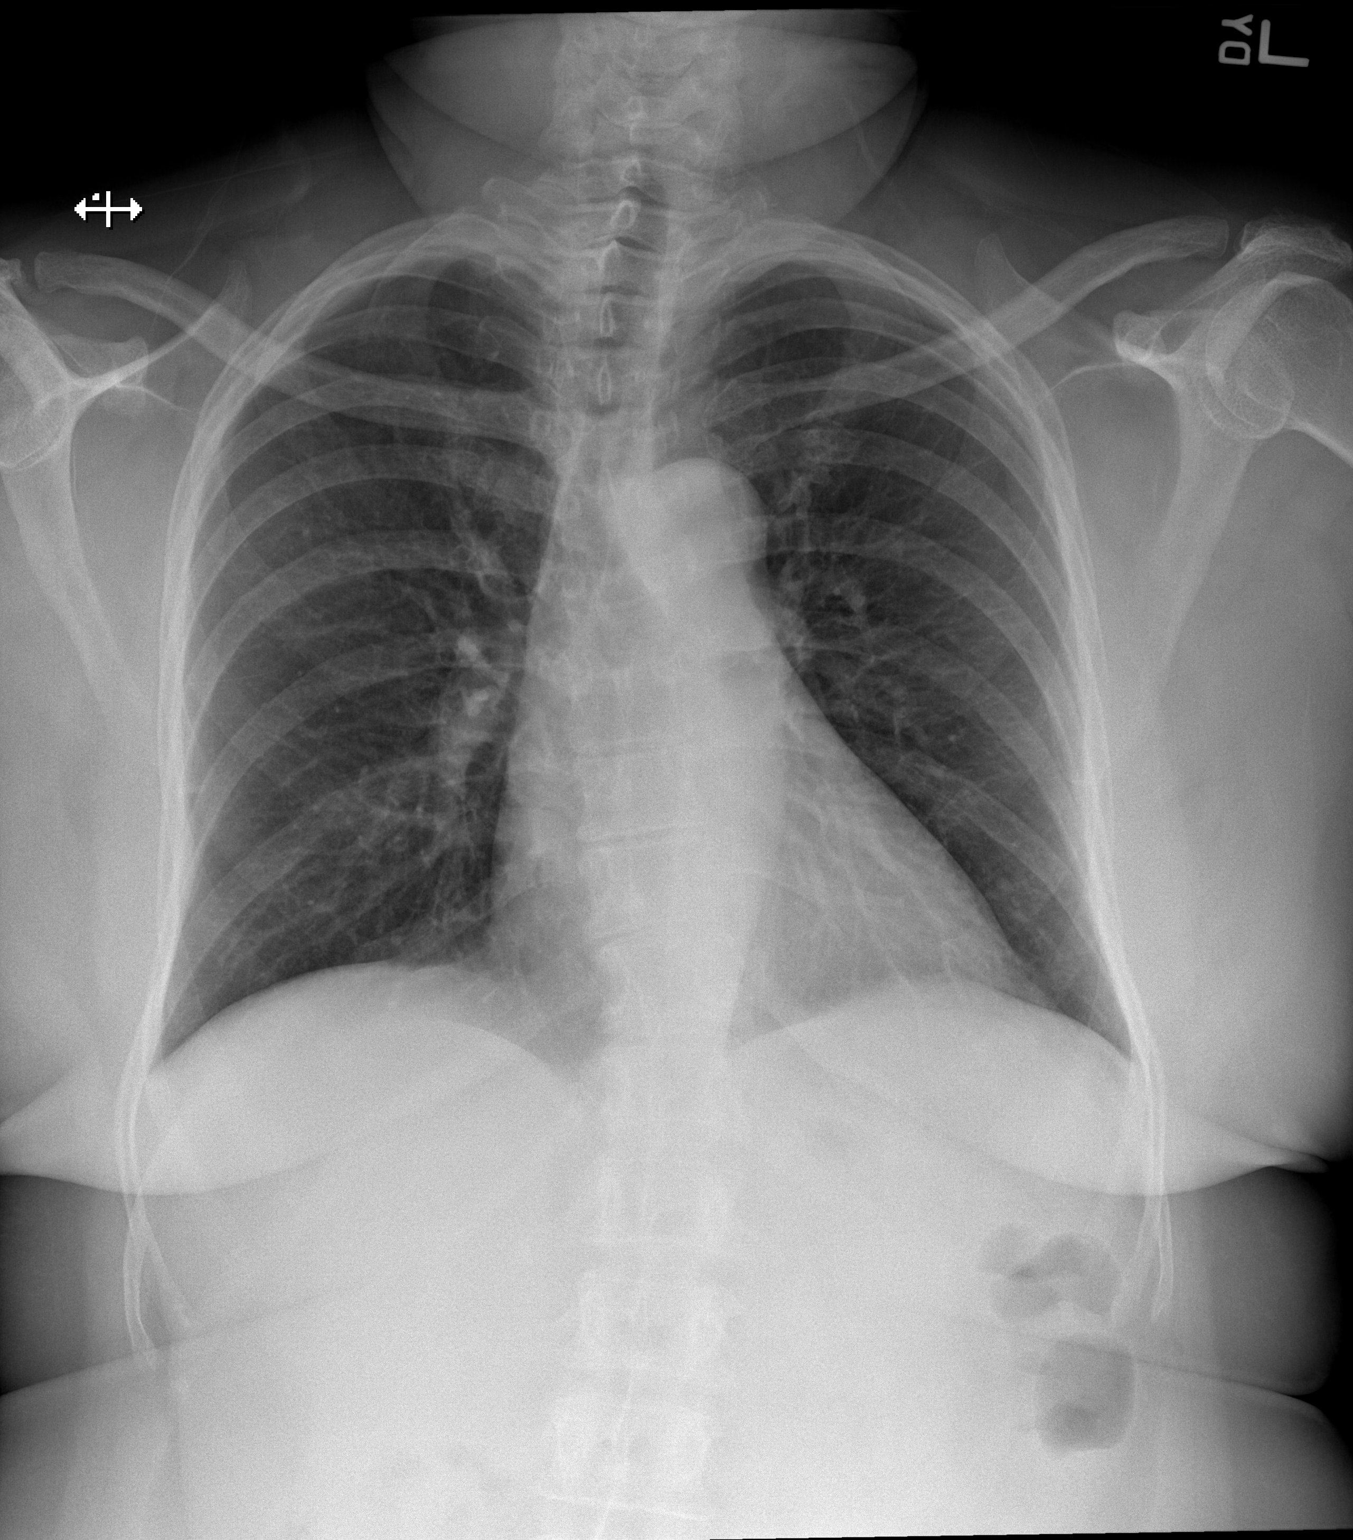
[im 2/2]
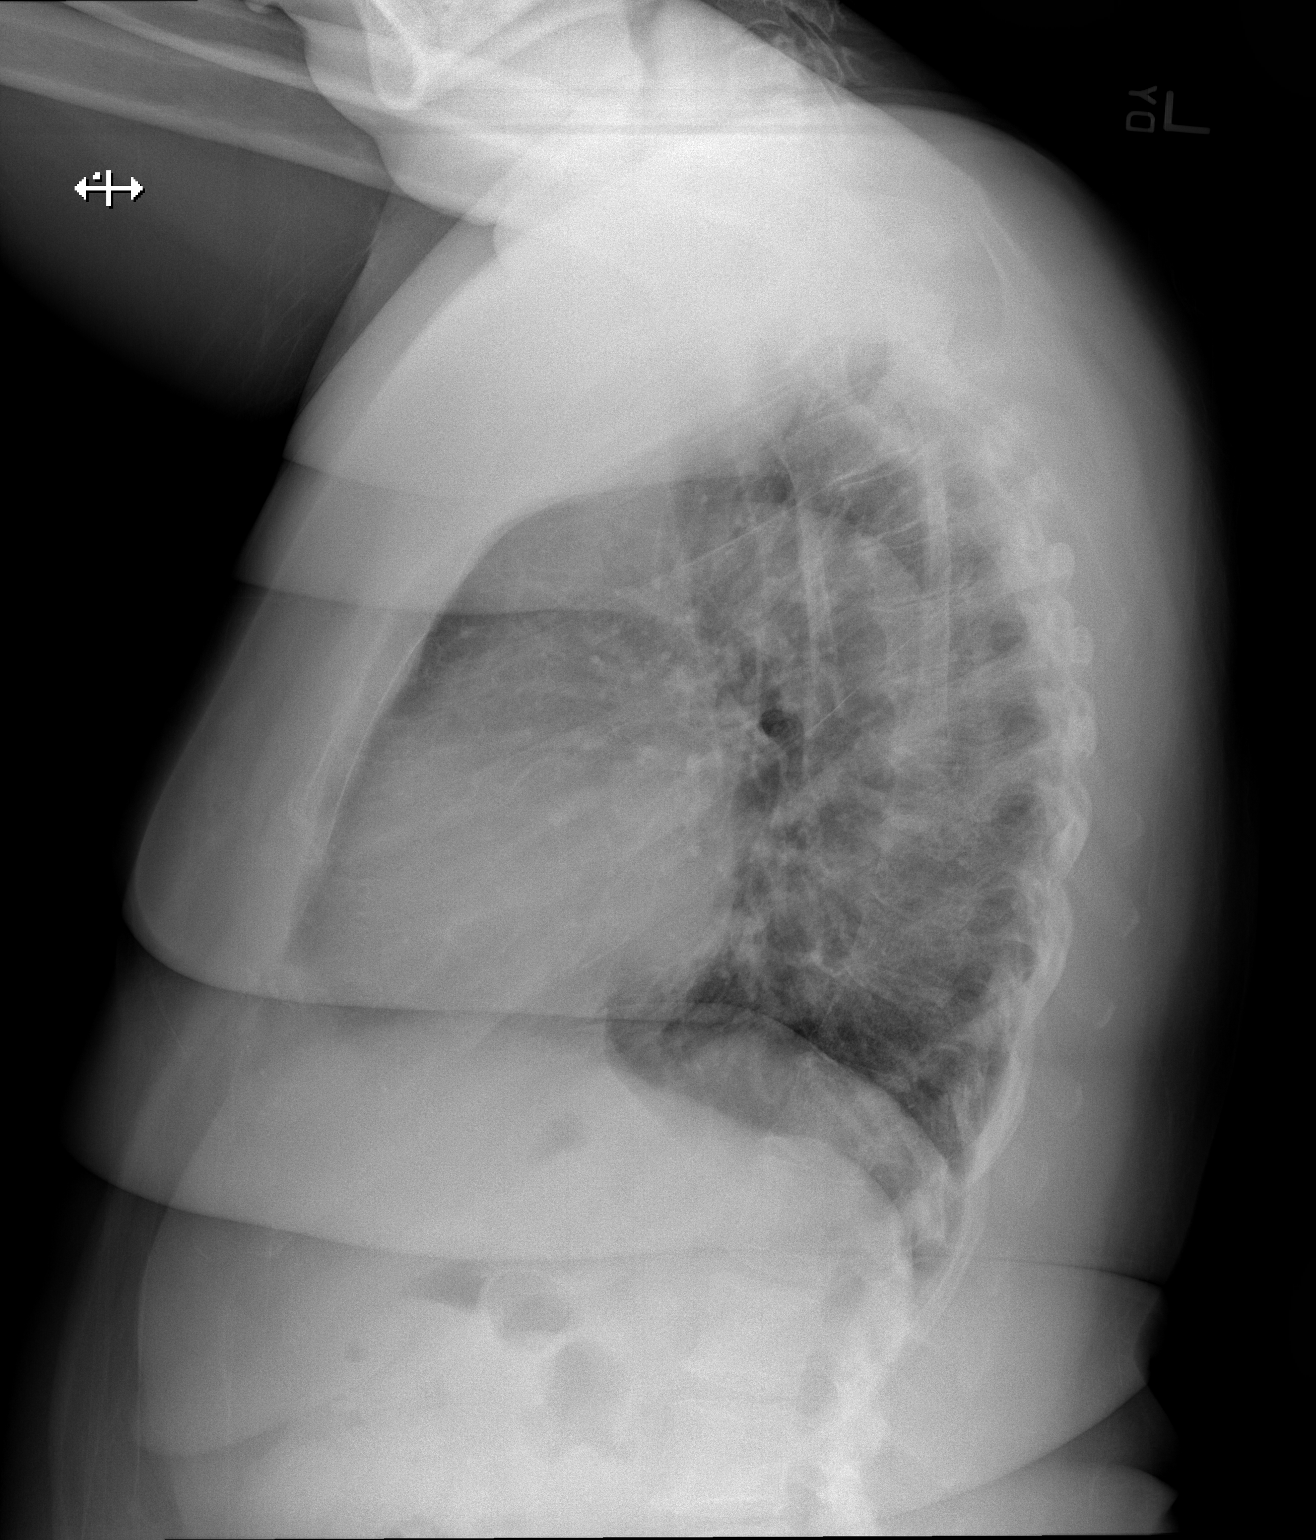

[2 of 2 positions shown; findings below may reference images not displayed]

FINDINGS: Heart size within normal limits.

There is no evidence of airspace consolidation within the lungs.

No evidence of pleural effusion or pneumothorax.

No acute bony abnormality. S-shaped curvature of the thoracolumbar
spine.
IMPRESSION: No evidence of acute cardiopulmonary abnormality.

S-shaped curvature of the thoracolumbar spine.

## 2022-07-08 ENCOUNTER — Ambulatory Visit (LOCAL_COMMUNITY_HEALTH_CENTER): Payer: Self-pay

## 2022-07-08 DIAGNOSIS — Z111 Encounter for screening for respiratory tuberculosis: Secondary | ICD-10-CM

## 2022-07-08 NOTE — Progress Notes (Signed)
In nurse clinic for TB skin testing. 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 RN advises patient that she will not be able to receive resulting documentation from [facility]. Patient verbalizes understanding.   Placement documentation and PPDR reminder card given to patient.   Abagail Kitchens, RN

## 2022-11-17 IMAGING — CR DG FOOT COMPLETE 3+V*R*
3 series · 3 of 3 positions shown · non-contrast
Comparison: None.

CLINICAL DATA: Right foot pain.

EXAM:
RIGHT FOOT COMPLETE - 3+ VIEW

[foot ap]
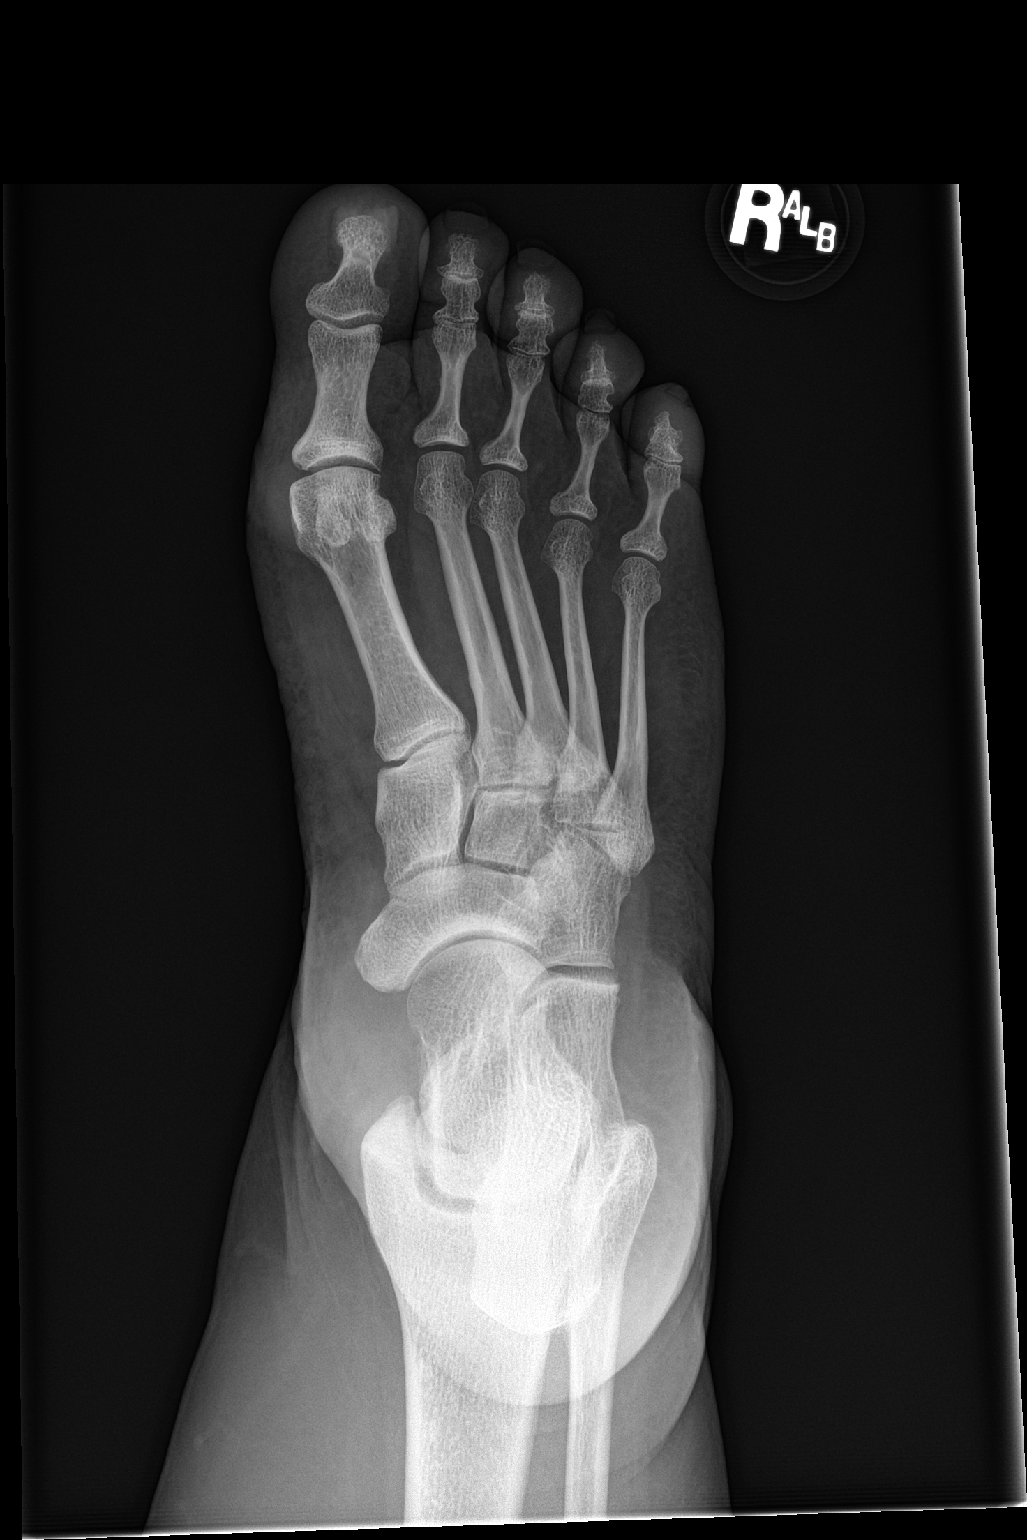

[foot obl]
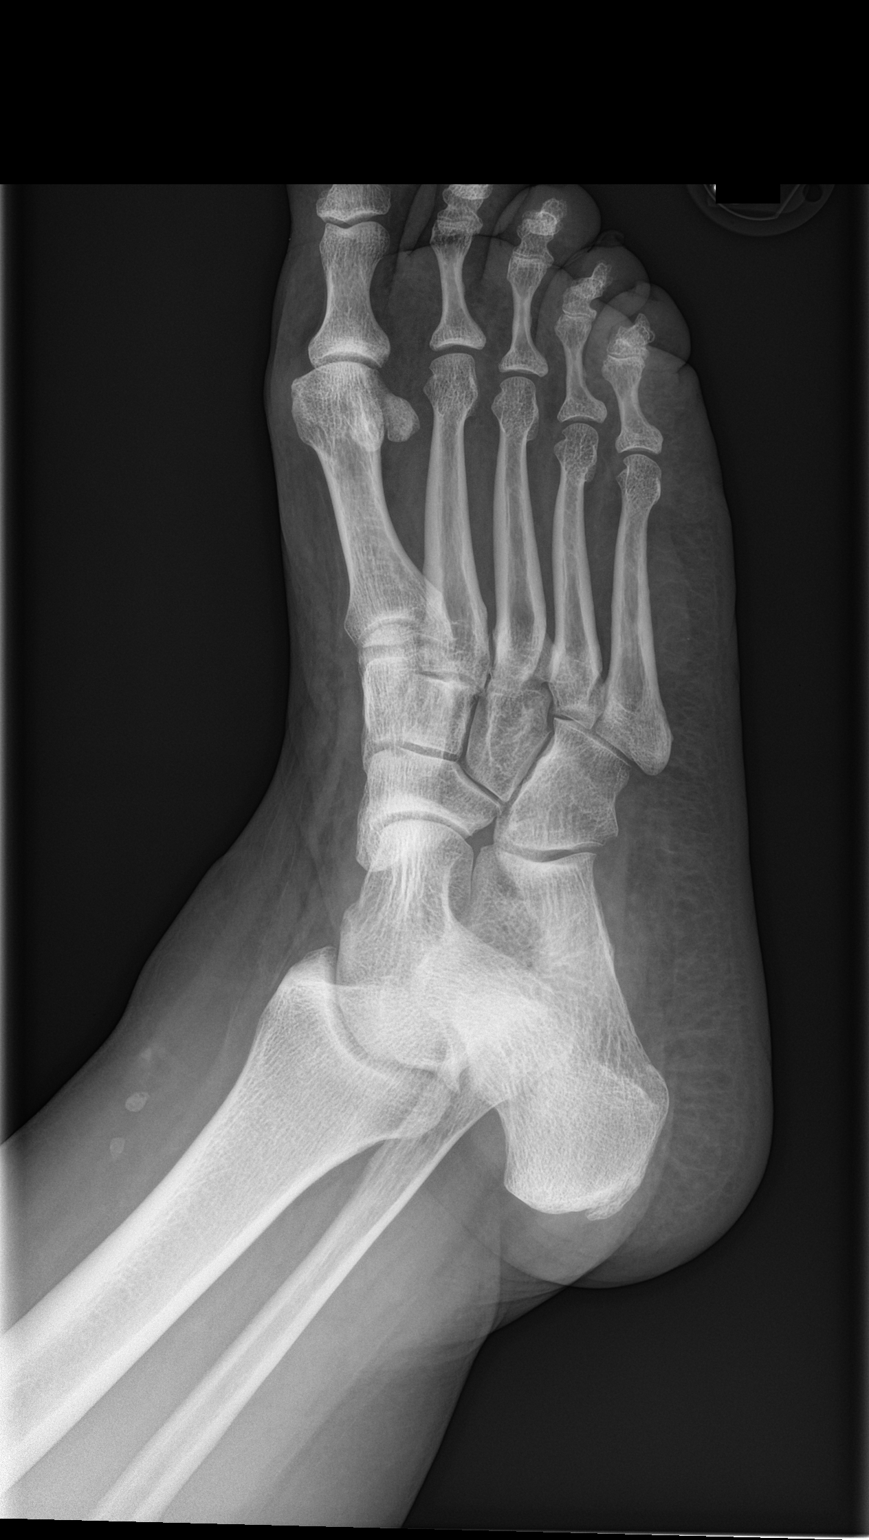

[foot lat]
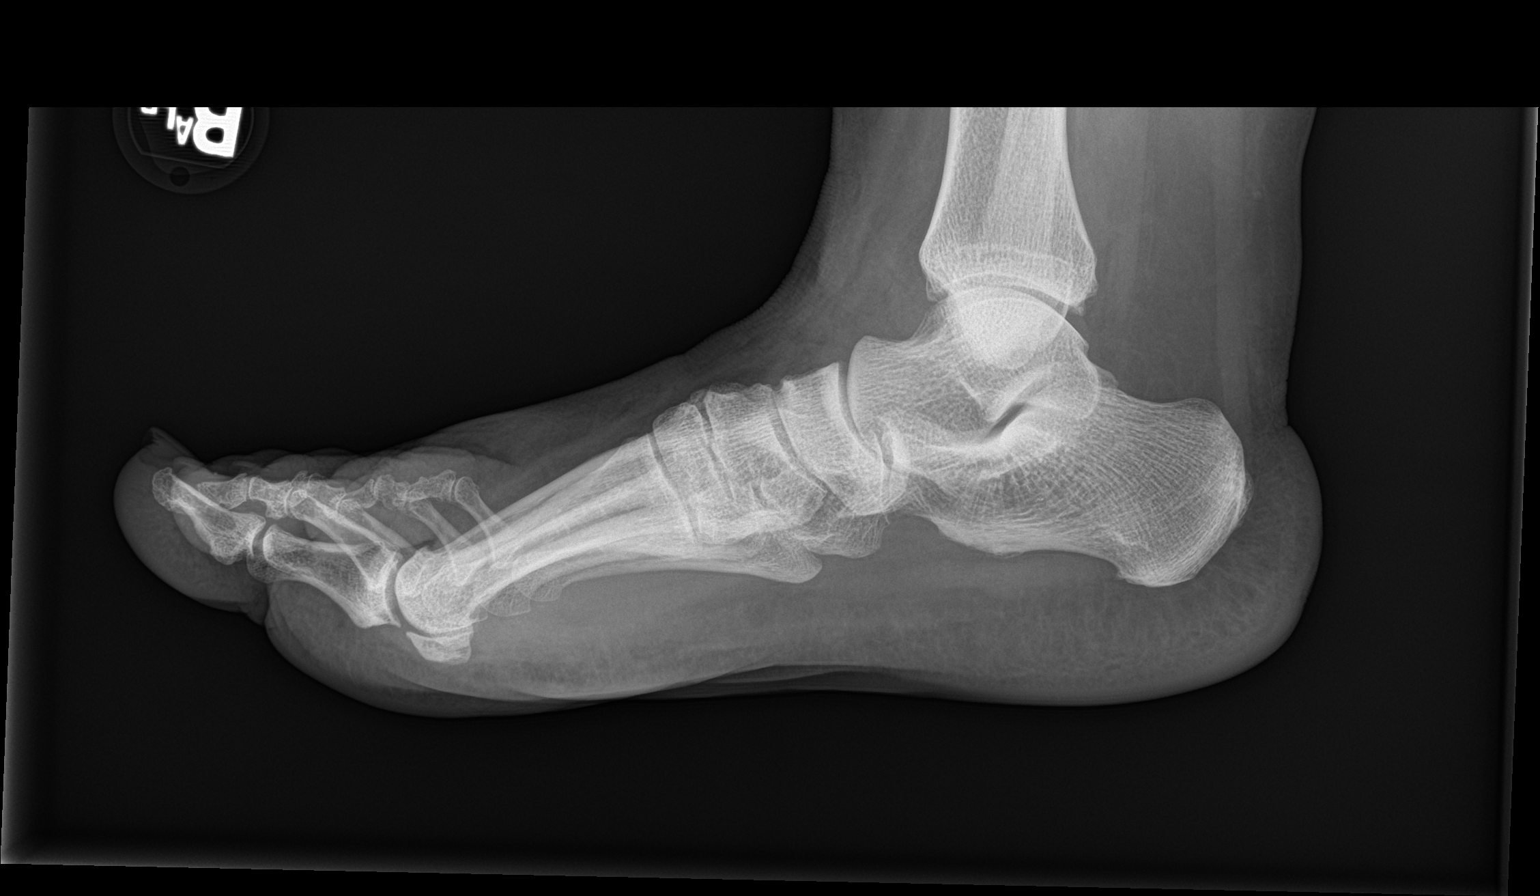

[3 of 3 positions shown; findings below may reference images not displayed]

FINDINGS: Negative for fracture or dislocation. Normal alignment. Minimal
spurring along the plantar aspect of the calcaneus. No focal soft
tissue abnormality.
IMPRESSION: No acute abnormality to the right foot.
